# Patient Record
Sex: Female | Born: 1980 | Race: White | Hispanic: No | Marital: Married | Smoking: Never smoker
Health system: Southern US, Community
[De-identification: ages and names within clinical notes are randomized; demographics above are authoritative.]

## PROBLEM LIST (undated history)

## (undated) DIAGNOSIS — G4700 Insomnia, unspecified: Secondary | ICD-10-CM

## (undated) DIAGNOSIS — N135 Crossing vessel and stricture of ureter without hydronephrosis: Secondary | ICD-10-CM

## (undated) DIAGNOSIS — Z808 Family history of malignant neoplasm of other organs or systems: Secondary | ICD-10-CM

## (undated) DIAGNOSIS — K219 Gastro-esophageal reflux disease without esophagitis: Secondary | ICD-10-CM

## (undated) DIAGNOSIS — Z801 Family history of malignant neoplasm of trachea, bronchus and lung: Secondary | ICD-10-CM

## (undated) DIAGNOSIS — R87619 Unspecified abnormal cytological findings in specimens from cervix uteri: Secondary | ICD-10-CM

## (undated) DIAGNOSIS — F329 Major depressive disorder, single episode, unspecified: Secondary | ICD-10-CM

## (undated) DIAGNOSIS — T7840XA Allergy, unspecified, initial encounter: Secondary | ICD-10-CM

## (undated) DIAGNOSIS — F32A Depression, unspecified: Secondary | ICD-10-CM

## (undated) DIAGNOSIS — Z803 Family history of malignant neoplasm of breast: Secondary | ICD-10-CM

## (undated) DIAGNOSIS — Z8049 Family history of malignant neoplasm of other genital organs: Secondary | ICD-10-CM

## (undated) DIAGNOSIS — Z87448 Personal history of other diseases of urinary system: Secondary | ICD-10-CM

## (undated) DIAGNOSIS — E669 Obesity, unspecified: Secondary | ICD-10-CM

## (undated) HISTORY — PX: COLPOSCOPY: SHX161

## (undated) HISTORY — DX: Depression, unspecified: F32.A

## (undated) HISTORY — DX: Crossing vessel and stricture of ureter without hydronephrosis: N13.5

## (undated) HISTORY — DX: Major depressive disorder, single episode, unspecified: F32.9

## (undated) HISTORY — DX: Unspecified abnormal cytological findings in specimens from cervix uteri: R87.619

## (undated) HISTORY — DX: Obesity, unspecified: E66.9

## (undated) HISTORY — DX: Family history of malignant neoplasm of other genital organs: Z80.49

## (undated) HISTORY — DX: Insomnia, unspecified: G47.00

## (undated) HISTORY — DX: Family history of malignant neoplasm of other organs or systems: Z80.8

## (undated) HISTORY — DX: Personal history of other diseases of urinary system: Z87.448

## (undated) HISTORY — DX: Family history of malignant neoplasm of breast: Z80.3

## (undated) HISTORY — DX: Family history of malignant neoplasm of trachea, bronchus and lung: Z80.1

## (undated) HISTORY — DX: Gastro-esophageal reflux disease without esophagitis: K21.9

## (undated) HISTORY — DX: Allergy, unspecified, initial encounter: T78.40XA

---
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
## 2005-07-23 DIAGNOSIS — R87619 Unspecified abnormal cytological findings in specimens from cervix uteri: Secondary | ICD-10-CM

## 2005-07-23 HISTORY — DX: Unspecified abnormal cytological findings in specimens from cervix uteri: R87.619

## 2008-05-18 ENCOUNTER — Ambulatory Visit: Payer: Self-pay | Admitting: Obstetrics & Gynecology

## 2009-04-26 ENCOUNTER — Encounter: Payer: Self-pay | Admitting: Obstetrics & Gynecology

## 2009-04-26 ENCOUNTER — Ambulatory Visit: Payer: Self-pay | Admitting: Obstetrics & Gynecology

## 2009-04-26 LAB — CONVERTED CEMR LAB
ALT: 8 units/L (ref 0–35)
AST: 10 units/L (ref 0–37)
Albumin: 4.3 g/dL (ref 3.5–5.2)
Alkaline Phosphatase: 86 units/L (ref 39–117)
BUN: 9 mg/dL (ref 6–23)
CO2: 24 meq/L (ref 19–32)
Calcium: 9.7 mg/dL (ref 8.4–10.5)
Chloride: 106 meq/L (ref 96–112)
Creatinine, Ser: 0.67 mg/dL (ref 0.40–1.20)
Glucose, Bld: 93 mg/dL (ref 70–99)
HCT: 44.2 % (ref 36.0–46.0)
HCV Ab: NEGATIVE
Hemoglobin: 14 g/dL (ref 12.0–15.0)
Hepatitis B Surface Ag: NEGATIVE
MCHC: 31.7 g/dL (ref 30.0–36.0)
MCV: 89.8 fL (ref 78.0–100.0)
Platelets: 375 10*3/uL (ref 150–400)
Potassium: 4.8 meq/L (ref 3.5–5.3)
RBC: 4.92 M/uL (ref 3.87–5.11)
RDW: 12.8 % (ref 11.5–15.5)
Sodium: 142 meq/L (ref 135–145)
TSH: 1.326 microintl units/mL (ref 0.350–4.500)
Total Bilirubin: 0.9 mg/dL (ref 0.3–1.2)
Total Protein: 7.2 g/dL (ref 6.0–8.3)
WBC: 7.9 10*3/uL (ref 4.0–10.5)

## 2010-07-05 ENCOUNTER — Ambulatory Visit: Payer: Self-pay | Admitting: Obstetrics & Gynecology

## 2010-12-05 NOTE — Assessment & Plan Note (Signed)
Cassandra Wyatt, Cassandra Wyatt NO.:  [id number]   MEDICAL RECORD NO.:  [id number]          PATIENT TYPE:  POB   LOCATION:  [facility] at [facility]         FACILITY:  [facility]   PHYSICIAN:  Jaynie Collins, MD     DATE OF BIRTH:  1981/03/17   DATE OF SERVICE:  04/26/2009                                  CLINIC NOTE   The patient is a 30 year old gravida 0 with last menstrual period of  April 19, 2009, who is here for annual examination.  The patient has  no gynecologic complaints.  She denies any intermenstrual bleeding,  abnormal vaginal discharge, and is satisfied on her Zovia.  The patient  does report having a concern about her left breast infected at the  nipple ring site.  She has noted some yellowish drainage over the past  few weeks.  No tenderness or erythema, but she is concerned about  possible infection and wants antibiotics for this.  She denies any other  complaints.   PAST OBSTETRIC AND GYNECOLOGIC HISTORY:  Menarche at age 3 and the  patient currently has regular menstrual periods on Zovia.  She uses  condoms for extra contraception and also for sexually transmitted  infection prevention.  Her last Pap smear was on September 04, 2007, and  was negative.  She does have a history of cervical dysplasia in 2007 and  subsequent colposcopy and repeat Pap smears were negative.  She had a  mammogram because of some breast issues in 2007 which was normal.  No  recurrence in any breast problems apart from what was mentioned in the  history of present illness.   PAST MEDICAL PROBLEMS:  Bifurcated ureter.   PAST SURGICAL HISTORY:  Correction of the bifurcated ureter, which  occurred in December 27, 1994, at [facility].   MEDICATIONS:  Zovia and multivitamins.   ALLERGIES:  The patient is allergic to AUGMENTIN which causes hives;  POISON IVY which causes blisters that get as big as rocks and are  diffuse and BEE and WASP STINGS for which the site gets swollen for  several  weeks.  She denies any anaphylaxis reaction to this or any of  her other allergies.  She is not allergic to latex.   SOCIAL HISTORY:  The patient lives with her mother.  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  The patient is currently in  school, trying to get her third degree of bachelors in criminal justice  and she goes to [employer].  The patient does not smoke, drink,  or use any illicit drugs.  She has no past or current history of  physical or sexual abuse.   FAMILY HISTORY:  Notable for breast and cervical cancer in her mother  and maternal grandmother.  Her mother was diagnosed with breast cancer  at age 26, so the patient is eligible for mammograms starting at age 30.  She also had an extensive family history of diabetes, heart disease,  heart attack, and depression.   REVIEW OF SYSTEMS:  The patient just reports her concern about her left  breast.  She also endorses having some swelling in  her legs which has  been going on for years.  The patient does not have any primary care  physician.   PHYSICAL EXAMINATION:  VITAL SIGNS:  Pulse 87, blood pressure 127/90,  weight 226 pounds, height 6 feet.  GENERAL:  No apparent distress.  HEENT:  Normocephalic, atraumatic.  NECK:  Supple.  No masses.  Normal thyroid.  HEART:  Regular rate and rhythm.  LUNGS:  Clear to auscultation bilaterally.  BREASTS:  Symmetric in size, soft, nontender, no abnormal masses  palpated.  No skin changes, nipple drainage, or lymphadenopathy.  The  patient does have bilateral nipple rings, but no discharge was expressed  or any erythema or any other concerning signs of infection.  ABDOMEN:  Soft, nontender, and nondistended.  The patient does have a  pierced umbilicus.  EXTREMITIES:  No clubbing, cyanosis, or edema that was noted today on  examination.  PELVIC:  Normal external female genitalia.  Pink, well-rugated vagina,  normal discharge.  Nulliparous cervical os noted.   Pap smear obtained.  On bimanual exam, the patient has a small mobile uterus and normal  adnexa bilaterally.   ASSESSMENT AND PLAN:  The patient is a 30 year old gravida 0 who is here  for annual examination.  The patient had a normal breast examination.  She was told that if her infections recur, she does need to take off her  nipple ring and she was given a prescription for ciprofloxacin to take  if she does have a recurrent breast infection.  The patient also had a  Pap Smear today.  We will follow up on the results.  She is interested  in having sexually transmitted infection screening and also preventative  health maintenance labs.  These will all be drawn today and we will  follow up on the results.  The patient was told to come back for any  further gynecologic concerns or in 1 year for annual examination.           ______________________________  Jaynie Collins, MD     UA/MEDQ  D:  04/26/2009  T:  04/27/2009  Job:  [id number]

## 2010-12-05 NOTE — Assessment & Plan Note (Signed)
Cassandra Wyatt, ACHENBACH NO.:  [id number]   MEDICAL RECORD NO.:  [id number]          PATIENT TYPE:  POB   LOCATION:  [facility] at [facility]         FACILITY:  [facility]   PHYSICIAN:  Johnella Moloney, MD        DATE OF BIRTH:  [birth year]   DATE OF SERVICE:  05/18/2008                                  CLINIC NOTE   The patient is a 30 year old gravida zero who is here for birth control  consult.  The patient reports that since 2007, she was placed on Ortho  Tri-Cyclen and started to have irregular bleeding.  She went to multiple  pills, including Ortho-Cyclen, Yaz, and another pill that she cannot  remember at this point, but continued to have irregular spotting.  She  was placed on Depo-Provera earlier this year, but proceeded to have 6  months of constant bleeding ranging from spotting to a full period and  she also gained 40 pounds in 3 months with no change in her food or  exercise as she kept a strict diary of both of these.  Her last Depo-  Provera shot was in May 2009.  The patient's last period was May 04, 2008.  She had 10 days of bleeding.  She is here today interested in  using another form of birth control to see if this can help in  regulating her periods and also in providing adequate contraception.  The patient had no other concerns.   PAST OBSTETRICAL/GYNECOLOGICAL HISTORY:  The patient had menarche at age  43 which was characterized by spotting.  Her regular menstrual  periods did not start until she was age 67.  She had regular menstrual  cycles and periods lasting 10-12 days with 25-27 day cycles  characterized by heavy bleeding and mild to moderate pain.  Her periods  were regular until she was started on birth control pills according to  the patient.  She has never been pregnant.  She also uses condoms for  extra contraception and also STI prevention.  Her last Pap smear was on  September 04, 2007.  She does have a history of cervical dysplasia in  2007 and subsequent colpo was benign and repeat Paps were negative.  Her  last mammogram was in 2007 which was normal.   PAST MEDICAL HISTORY:  Kidney problems.   PAST SURGICAL HISTORY:  She had a bifurcated ureter and she underwent  surgery for correction of this on December 27, 1994 at [facility].   MEDICATIONS:  Multivitamins.   ALLERGIES:  The patient is allergic to AUGMENTIN, BEES, WASPS AND POISON  IVY.   SOCIAL HISTORY:  The patient lives with her mother.  She currently works  in a school system.  She does not smoke, drink alcohol or use any  illicit drugs.  She denies any past or current history of sexual  physical abuse.   FAMILY HISTORY:  Is notable for breast and cervical cancer in her mother  and maternal grandmother.  She also has an extensive family history of  diabetes, heart disease, heart attack and depression.   REVIEW OF SYSTEMS:  The patient endorses weight gain and  swelling in  legs.   IMPRESSION:  The patient is a 30 year old G zero here for a consultation  for birth control.  Given her problems with past pills it is likely that  her irregular bleeding was caused by not having enough estrogen present.  Given that her bleeding was started on a triphasic pill and also Ortho  Cyclen Low, the patient will be given a prescription for Zovia 1/35.  Zovia contains ethynodiol diacetate and is progestin.  This is a very  estrogenic progestin in addition to do regular estradiol and this should  provide some stable levy to her endometrial lining.  We will try this  for the next 2 months and see if her bleeding profile does improve.  The  patient does understand that if her bleeding does not improve on this,  we will discuss other options such as an IUD or NuvaRing.  She is not  interested in these modalities at the moment.  The patient will follow  up in 2 months for OCP surveillance and followup of her bleeding.           ______________________________  Johnella Moloney,  MD     UD/MEDQ  D:  05/18/2008  T:  05/18/2008  Job:  [id number]

## 2010-12-05 NOTE — Assessment & Plan Note (Signed)
NAMEPERL, FOLMAR NO.:  [id number]   MEDICAL RECORD NO.:  [id number]          PATIENT TYPE:  POB   LOCATION:  [facility] at [facility]         FACILITY:  [facility]   PHYSICIAN:  Scheryl Darter, MD       DATE OF BIRTH:  12-31-1980   DATE OF SERVICE:                                  CLINIC NOTE   Patient comes today for yearly examination.  She wants to restart oral  contraceptives.  The patient is a 29 year old white female gravida 0,  last menstrual period June 16, 2010.  She has run out of her oral  contraceptives about 2 months ago, but she wants to restart now, she  needs new prescription.  She has used condoms since stopping her pills.  Her periods have been regular.   PAST MEDICAL HISTORY:  Bifurcated ureters bilaterally in 1996.   MEDICATIONS:  Tylenol p.r.n. headache.   ALLERGY:  To AUGMENTIN, POISON IVY, and BEE AND WASP STINGS.   SOCIAL HISTORY:  The patient is single.  She denies any alcohol,  tobacco, or drug use.  No history of abuse.   FAMILY HISTORY:  Breast and cervical cancer in her mother and maternal  grandmother.  Also history of diabetes, heart disease, heart attack, and  depression.   REVIEW OF SYSTEMS:  The patient has had regular menses off her oral  contraceptives.  No nausea and vomiting, fever, weight change, pain,  diarrhea, swelling.   PHYSICAL EXAMINATION:  GENERAL:  The patient in no acute distress.  Normal affect.  VITAL SIGNS:  Weight is 246 pounds, height 5 feet 11 inches, blood  pressure 113/82, pulse 80.  CHEST:  Clear.  HEART:  Regular rhythm.  No thyromegaly.  BREASTS:  Symmetric.  No mass, tenderness, or axillary lymphadenopathy.  ABDOMEN:  Mildly obese, soft, nontender.  No mass.  EXTREMITIES:  Show no swelling or deformity.  PELVIC:  External genitalia, vagina and cervix are nulliparous.  Pap was  done.  Uterus normal size, nontender.  No adnexal masses.   IMPRESSION:  Patient is doing well, currently off her  oral  contraceptives.  She can restart these.   PLAN:  Gave prescription for Zovia.  She can start this after the onset  of her next menses.   RECOMMENDATION:  She return for yearly exams.  She will notify us of her  Pap test results.      Scheryl Darter, MD     JA/MEDQ  D:  07/05/2010  T:  07/06/2010  Job:  [id number]

## 2011-07-10 ENCOUNTER — Encounter: Payer: Self-pay | Admitting: Obstetrics and Gynecology

## 2011-07-10 ENCOUNTER — Ambulatory Visit (INDEPENDENT_AMBULATORY_CARE_PROVIDER_SITE_OTHER): Payer: 59 | Admitting: Obstetrics and Gynecology

## 2011-07-10 DIAGNOSIS — Z01419 Encounter for gynecological examination (general) (routine) without abnormal findings: Secondary | ICD-10-CM

## 2011-07-10 DIAGNOSIS — Z113 Encounter for screening for infections with a predominantly sexual mode of transmission: Secondary | ICD-10-CM

## 2011-07-10 DIAGNOSIS — Z124 Encounter for screening for malignant neoplasm of cervix: Secondary | ICD-10-CM

## 2011-07-10 DIAGNOSIS — Z1272 Encounter for screening for malignant neoplasm of vagina: Secondary | ICD-10-CM

## 2011-07-10 DIAGNOSIS — N920 Excessive and frequent menstruation with regular cycle: Secondary | ICD-10-CM

## 2011-07-10 NOTE — Progress Notes (Signed)
  Subjective:    Patient ID: Cassandra Wyatt, female    DOB: 1981-01-17, 30 y.o.   MRN: [id number]  HPI  30yo G0 here for annual exam. Patient has been doing well and has lost 32lb since her last visit. Patient reports breakthrough bleeding with current birth control and desires the IUD. Patient is otherwise without any complaints. She is currently in a monogamous relationship for the past 3 months and desires STD testing  Past Medical History  Diagnosis Date  . 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 History of kidney problems   . Abnormal Pap smear of cervix 2007   Past Surgical History  Procedure Date  . Ureter surgery 12/1994    correction of bifurcated ureter  . Colposcopy    Family History  Problem Relation Age of Onset  . Diabetes Mother   . Cancer Mother 65    cervical and breast /2005  . Cancer Maternal Grandmother      cervical  . Heart attack Maternal Grandmother   . Heart attack Father   . Diabetes Maternal Grandfather   . Heart disease Maternal Grandfather   . Heart attack Maternal Grandfather   . Heart disease Paternal Grandmother    History  Substance Use Topics  . Smoking status: Never Smoker   . Smokeless tobacco: Not on file  . Alcohol Use: No     Review of Systems  All other systems reviewed and are negative.       Objective:   Physical Exam GENERAL: Well-developed, well-nourished female in no acute distress.  HEENT: Normocephalic, atraumatic. Sclerae anicteric.  NECK: Supple. Normal thyroid.  LUNGS: Clear to auscultation bilaterally.  HEART: Regular rate and rhythm. BREASTS: Symmetric in size. No palpable masses or lymphadenopathy, skin changes, or nipple drainage. ABDOMEN: Soft, nontender, nondistended. No organomegaly. PELVIC: Normal external female genitalia. Vagina is pink and rugated.  Normal discharge. Normal appearing cervix. Uterus is normal in size. No adnexal mass or tenderness. EXTREMITIES: No cyanosis,  clubbing, or edema, 2+ distal pulses.     Assessment & Plan:  30 yo Go here for annual exam -pap smear collected -patient will be tested for HIV, hep B, C and RPR -patient declined flu vaccine - patient will be contacted with any abnormal results - patient to return for IUD insertion - patient encouraged to continue her weight loss regimen

## 2011-07-10 NOTE — Patient Instructions (Signed)

## 2011-07-11 ENCOUNTER — Other Ambulatory Visit: Payer: Self-pay | Admitting: Obstetrics & Gynecology

## 2011-07-11 LAB — HIV ANTIBODY (ROUTINE TESTING W REFLEX): HIV: NONREACTIVE

## 2011-07-11 LAB — HEPATITIS B SURFACE ANTIGEN: Hepatitis B Surface Ag: NEGATIVE

## 2011-07-18 ENCOUNTER — Ambulatory Visit: Payer: 59 | Admitting: Obstetrics & Gynecology

## 2011-07-30 ENCOUNTER — Ambulatory Visit (INDEPENDENT_AMBULATORY_CARE_PROVIDER_SITE_OTHER): Payer: 59 | Admitting: Obstetrics & Gynecology

## 2011-07-30 ENCOUNTER — Encounter: Payer: Self-pay | Admitting: Obstetrics & Gynecology

## 2011-07-30 VITALS — BP 127/81 | Ht 71.0 in | Wt 230.1 lb

## 2011-07-30 DIAGNOSIS — Z30430 Encounter for insertion of intrauterine contraceptive device: Secondary | ICD-10-CM

## 2011-07-30 DIAGNOSIS — Z309 Encounter for contraceptive management, unspecified: Secondary | ICD-10-CM

## 2011-07-30 MED ORDER — MISOPROSTOL 200 MCG PO TABS: ORAL_TABLET | ORAL | Status: DC

## 2011-07-30 NOTE — Progress Notes (Signed)
Addended by: Pennie Banter on: 07/30/2011 02:38 PM   Modules accepted: Orders

## 2011-07-30 NOTE — Progress Notes (Signed)
  Subjective:    Patient ID: Cassandra Wyatt, female    DOB: 16-Mar-1981, 31 y.o.   MRN: [id number]  HPI  Carrissa is here for insertion of Mirena. All questions were answered. Pregnancy test was negative.  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      Assessment & Plan:  Desire for Mirena with nullip cervix. I will have her reschedule the Mirena insertion after she has been premedicated with cytotec.

## 2011-08-07 ENCOUNTER — Ambulatory Visit: Payer: 59 | Admitting: Obstetrics and Gynecology

## 2011-08-07 ENCOUNTER — Encounter: Payer: Self-pay | Admitting: Advanced Practice Midwife

## 2011-08-07 ENCOUNTER — Ambulatory Visit (INDEPENDENT_AMBULATORY_CARE_PROVIDER_SITE_OTHER): Payer: 59 | Admitting: Advanced Practice Midwife

## 2011-08-07 VITALS — BP 109/75 | HR 80 | Ht 71.0 in | Wt 230.0 lb

## 2011-08-07 DIAGNOSIS — N882 Stricture and stenosis of cervix uteri: Secondary | ICD-10-CM

## 2011-08-07 DIAGNOSIS — Z87898 Personal history of other specified conditions: Secondary | ICD-10-CM | POA: Insufficient documentation

## 2011-08-07 DIAGNOSIS — Z308 Encounter for other contraceptive management: Secondary | ICD-10-CM

## 2011-08-07 DIAGNOSIS — Z975 Presence of (intrauterine) contraceptive device: Secondary | ICD-10-CM

## 2011-08-07 LAB — POCT URINE PREGNANCY: Preg Test, Ur: NEGATIVE

## 2011-08-07 NOTE — Progress Notes (Signed)
  Subjective:    Patient ID: Cassandra Wyatt, female    DOB: [birth year], 31 y.o.   MRN: [id number]  HPI Here for re-attempt at Mirena IUD insertion.  Dr Marice Potter was not able to insert on last visit due to stenotic nulliparous cervix. Was given Cytotec to take last night. Has had breakthrough bleeding on pills and weight gain on DepoProvera. Really wants Mirena.   Review of Systems Negative    Objective:   Physical Exam  Constitutional: She is oriented to person, place, and time. She appears well-developed and well-nourished. No distress.  HENT:  Head: Normocephalic.  Pulmonary/Chest: Effort normal.  Abdominal: Soft. She exhibits no distension and no mass. There is no tenderness. There is no rebound and no guarding.  Genitourinary: Vagina normal and uterus normal. No vaginal discharge found.       Nulliparous cervix. Uterus small and midplane. Vagina normal.   Musculoskeletal: Normal range of motion.  Neurological: She is alert and oriented to person, place, and time.  Skin: Skin is warm and dry.          Assessment & Plan:  A:  Desires Mirena IUD P:  Time out performed. Pt desires re-attempt at insertion       Cervix cleansed with Betadine.       Tenaculum applied anteriorly        Attempted insertion of uterine sound without success.         Attempted to insert cotton swab (thin one with metal stem) without success. Able to insert only 2 cm into os.  Tried again with sound, unsuccessfully       Did not attempt Mirena insertion.      Discussed with Dr Debroah Loop who suggested alternative contraception. Pt had been called by Dr Marice Potter and offered the new Mirena which is smaller. She wants to try that in February. Given OCPs samples to use in the meantime. If heavy bleeding occurs, take 2 pills a day for several days to slow it down. Does not want to use Depo. Boyfriend is visiting in 2 wks and she needs contraception.

## 2011-09-12 ENCOUNTER — Telehealth: Payer: Self-pay | Admitting: *Deleted

## 2011-09-12 DIAGNOSIS — IMO0001 Reserved for inherently not codable concepts without codable children: Secondary | ICD-10-CM

## 2011-09-12 MED ORDER — NORETHIN-ETH ESTRAD-FE BIPHAS 1 MG-10 MCG / 10 MCG PO TABS: 1.0000 | ORAL_TABLET | Freq: Every day | ORAL | Status: DC

## 2011-09-12 NOTE — Telephone Encounter (Signed)
Patient needs a refill of Lo lo estrin fe it is working well until we can get the [place] device in.

## 2011-09-13 ENCOUNTER — Ambulatory Visit: Payer: 59 | Admitting: Obstetrics & Gynecology

## 2011-10-15 ENCOUNTER — Encounter: Payer: Self-pay | Admitting: Obstetrics & Gynecology

## 2011-10-15 ENCOUNTER — Ambulatory Visit (INDEPENDENT_AMBULATORY_CARE_PROVIDER_SITE_OTHER): Payer: 59 | Admitting: Obstetrics & Gynecology

## 2011-10-15 VITALS — BP 125/95 | HR 100 | Ht 71.0 in | Wt 236.0 lb

## 2011-10-15 DIAGNOSIS — Z975 Presence of (intrauterine) contraceptive device: Secondary | ICD-10-CM

## 2011-10-15 DIAGNOSIS — Z01812 Encounter for preprocedural laboratory examination: Secondary | ICD-10-CM

## 2011-10-15 DIAGNOSIS — Z30430 Encounter for insertion of intrauterine contraceptive device: Secondary | ICD-10-CM

## 2011-10-15 LAB — POCT URINE PREGNANCY: Preg Test, Ur: NEGATIVE

## 2011-10-15 NOTE — Progress Notes (Signed)
Addended by: Barbara Cower on: 10/15/2011 04:13 PM   Modules accepted: Orders

## 2011-10-15 NOTE — Progress Notes (Signed)
  Subjective:    Patient ID: Cassandra Wyatt, female    DOB: 31-Mar-1981, 31 y.o.   MRN: [id number]  HPI Cassandra Wyatt would really like to IUD for birth control because she gained 60 pounds with the depo provera and she has DUB/BTB with the OCPs and she scars badly so she doesn't want a Nexplanon.   Review of Systems     Objective:   Physical Exam  Bimanual exam done, cervix prepped with betadine. Anterior lip of cervix grasped with single tooth tenaculum. Neither a yellow dilator or a uterine sound would go past 2 cm into the os. I did try to place the [facility], but it also wouldn't go past 2 cm past the os.      Assessment & Plan:  She will try the Nuva ring.

## 2012-01-08 ENCOUNTER — Telehealth: Payer: Self-pay

## 2012-01-08 NOTE — Telephone Encounter (Signed)
Patient called requesting Nuva Ring refill. We gave her a couple of samples to try out on her last visit and she liked it. Per nurse I called in Nuva ring in with 5 refills to her pharmacy.

## 2012-01-11 ENCOUNTER — Encounter: Payer: Self-pay | Admitting: Obstetrics and Gynecology

## 2012-12-09 ENCOUNTER — Ambulatory Visit (INDEPENDENT_AMBULATORY_CARE_PROVIDER_SITE_OTHER): Payer: BC Managed Care – PPO | Admitting: Family Medicine

## 2012-12-09 ENCOUNTER — Ambulatory Visit (HOSPITAL_COMMUNITY)
Admission: RE | Admit: 2012-12-09 | Discharge: 2012-12-09 | Disposition: A | Discharge status: Home / Self Care | Payer: BC Managed Care – PPO | Source: Ambulatory Visit | Attending: Family Medicine | Admitting: Family Medicine

## 2012-12-09 VITALS — BP 110/68 | HR 82 | Temp 98.2°F | Resp 16 | Ht 71.0 in | Wt 259.0 lb

## 2012-12-09 DIAGNOSIS — R609 Edema, unspecified: Secondary | ICD-10-CM

## 2012-12-09 DIAGNOSIS — R635 Abnormal weight gain: Secondary | ICD-10-CM

## 2012-12-09 DIAGNOSIS — R600 Localized edema: Secondary | ICD-10-CM

## 2012-12-09 DIAGNOSIS — G4700 Insomnia, unspecified: Secondary | ICD-10-CM

## 2012-12-09 LAB — POCT CBC
Granulocyte percent: 73.7 %G (ref 37–80)
HCT, POC: 43.9 % (ref 37.7–47.9)
MID (cbc): 0.4 (ref 0–0.9)
POC Granulocyte: 6 (ref 2–6.9)
POC LYMPH PERCENT: 21.7 %L (ref 10–50)
Platelet Count, POC: 369 10*3/uL (ref 142–424)
RBC: 4.85 M/uL (ref 4.04–5.48)
RDW, POC: 13.2 %

## 2012-12-09 LAB — COMPREHENSIVE METABOLIC PANEL
ALT: <8 U/L (ref 0–35)
AST: 9 U/L (ref 0–37)
Albumin: 4 g/dL (ref 3.5–5.2)
Alkaline Phosphatase: 86 U/L (ref 39–117)
Potassium: 4.2 mEq/L (ref 3.5–5.3)
Sodium: 139 mEq/L (ref 135–145)
Total Protein: 6.8 g/dL (ref 6.0–8.3)

## 2012-12-09 MED ORDER — FUROSEMIDE 20 MG PO TABS: 20.0000 mg | ORAL_TABLET | Freq: Every day | ORAL | Status: DC

## 2012-12-09 MED ORDER — TRAZODONE HCL 50 MG PO TABS: 25.0000 mg | ORAL_TABLET | Freq: Every evening | ORAL | Status: DC | PRN

## 2012-12-09 NOTE — Patient Instructions (Addendum)
Insomnia Insomnia is frequent trouble falling and/or staying asleep. Insomnia can be a long term problem or a short term problem. Both are common. Insomnia can be a short term problem when the wakefulness is related to a certain stress or worry. Long term insomnia is often related to ongoing stress during waking hours and/or poor sleeping habits. Overtime, sleep deprivation itself can make the problem worse. Every little thing feels more severe because you are overtired and your ability to cope is decreased. CAUSES   Stress, anxiety, and depression.  Poor sleeping habits.  Distractions such as TV in the bedroom.  Naps close to bedtime.  Engaging in emotionally charged conversations before bed.  Technical reading before sleep.  Alcohol and other sedatives. They may make the problem worse. They can hurt normal sleep patterns and normal dream activity.  Stimulants such as caffeine for several hours prior to bedtime.  Pain syndromes and shortness of breath can cause insomnia.  Exercise late at night.  Changing time zones may cause sleeping problems (jet lag). It is sometimes helpful to have someone observe your sleeping patterns. They should look for periods of not breathing during the night (sleep apnea). They should also look to see how long those periods last. If you live alone or observers are uncertain, you can also be observed at a sleep clinic where your sleep patterns will be professionally monitored. Sleep apnea requires a checkup and treatment. Give your caregivers your medical history. Give your caregivers observations your family has made about your sleep.  SYMPTOMS   Not feeling rested in the morning.  Anxiety and restlessness at bedtime.  Difficulty falling and staying asleep. TREATMENT   Your caregiver may prescribe treatment for an underlying medical disorders. Your caregiver can give advice or help if you are using alcohol or other drugs for self-medication. Treatment  of underlying problems will usually eliminate insomnia problems.  Medications can be prescribed for short time use. They are generally not recommended for lengthy use.  Over-the-counter sleep medicines are not recommended for lengthy use. They can be habit forming.  You can promote easier sleeping by making lifestyle changes such as:  Using relaxation techniques that help with breathing and reduce muscle tension.  Exercising earlier in the day.  Changing your diet and the time of your last meal. No night time snacks.  Establish a regular time to go to bed.  Counseling can help with stressful problems and worry.  Soothing music and white noise may be helpful if there are background noises you cannot remove.  Stop tedious detailed work at least one hour before bedtime. HOME CARE INSTRUCTIONS   Keep a diary. Inform your caregiver about your progress. This includes any medication side effects. See your caregiver regularly. Take note of:  Times when you are asleep.  Times when you are awake during the night.  The quality of your sleep.  How you feel the next day. This information will help your caregiver care for you.  Get out of bed if you are still awake after 15 minutes. Read or do some quiet activity. Keep the lights down. Wait until you feel sleepy and go back to bed.  Keep regular sleeping and waking hours. Avoid naps.  Exercise regularly.  Avoid distractions at bedtime. Distractions include watching television or engaging in any intense or detailed activity like attempting to balance the household checkbook.  Develop a bedtime ritual. Keep a familiar routine of bathing, brushing your teeth, climbing into bed at the same   time each night, listening to soothing music. Routines increase the success of falling to sleep faster.  Use relaxation techniques. This can be using breathing and muscle tension release routines. It can also include visualizing peaceful scenes. You can  also help control troubling or intruding thoughts by keeping your mind occupied with boring or repetitive thoughts like the old concept of counting sheep. You can make it more creative like imagining planting one beautiful flower after another in your backyard garden.  During your day, work to eliminate stress. When this is not possible use some of the previous suggestions to help reduce the anxiety that accompanies stressful situations. MAKE SURE YOU:   Understand these instructions.  Will watch your condition.  Will get help right away if you are not doing well or get worse. Document Released: 07/06/2000 Document Revised: 10/01/2011 Document Reviewed: 08/06/2007 [facility] Patient Information 2013 [place], [place].   Take trazodone 50 mg 1/2 hr before anticipated bedtime  Exercise  Use the furosemide 20 mg one each AM to see if it helps the swelling

## 2012-12-09 NOTE — Progress Notes (Signed)
Right lower extremity venous duplex.  Right:  No evidence of DVT, superficial thrombosis, or Baker's cyst.  Left:  Negative for DVT in the common femoral vein.

## 2012-12-09 NOTE — Progress Notes (Signed)
Subjective: Patient is here for 2 reasons. She is generally been healthy, but over the last couple of months she's had swelling of her regularly on a daily basis in her right leg. She's not had calf pain. She has not had edema in the left leg. 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 She does not get regular exercise. Nose no trauma to the leg. She did have some cutdowns when she was an infant.  She has gained a lot of weight the last few years. Several years ago she was placed in the upper Provera and then a three-month period of time gained about 6 pounds. She is persistence they have since then, though over the last year or so she's gained about 20 more. She says she is happy and content in life. She lives at her mothers. She is not under any major stressors.  She has problems with sleep. She had this which is a teenager and was on Remeron for a long time. She lost insurance and had loose of mastication. She's not been on it for many years, but then son is doing worse and worse. She lays awake for hours. She goes home from work, has dinner, reads, and goes on to bed. She has not get a lot of regular exercise.  Objective: Ablation lady in no acute distress. Neck supple without any thyromegaly. Chest is clear to. Heart regular without murmurs. Abdomen soft without mass or tenderness. Has 2+ pitting edema of the right leg. No calf tenderness. The edema is primarily in the ankles in the ankle area. No abnormality visible in the foot or toes. Pulses are present no not very strong.  Assessment unilateral ankle edema, rule out chronic DVT and venous valvular insufficiency Weight gain, round hyperthyroidism Sleep disturbance  Plan: Check labs Venous ultrasound of the right leg Lasix 20 daily as a trial. I doubt it we'll do the job. We'll probably need to work hard on weight loss and continue wearing compression hose Discuss sleep issues Trazodone  Return in 3 or 4 months if symptoms a doing  better and if she needs more medications, otherwise as needed.

## 2012-12-10 ENCOUNTER — Telehealth: Payer: Self-pay

## 2012-12-10 NOTE — Telephone Encounter (Signed)
Pt was seen yesterday by Dr. Alwyn Ren and she was wanting to know if the medication she was given could be switched to what she has taken in the past. Call back number is [phone]

## 2012-12-10 NOTE — Telephone Encounter (Signed)
What was she given in the past? She is asking for Remeron instead of the Trazadone, this has caused her to have a headache. Please advise.

## 2012-12-11 MED ORDER — MIRTAZAPINE 15 MG PO TABS: 15.0000 mg | ORAL_TABLET | Freq: Every day | ORAL | Status: DC

## 2012-12-11 NOTE — Telephone Encounter (Signed)
Notified pt who thanked [place].

## 2012-12-11 NOTE — Telephone Encounter (Signed)
Rx sent to pharmacy   

## 2012-12-11 NOTE — Telephone Encounter (Signed)
Message sent to Dr Alwyn Ren.

## 2012-12-11 NOTE — Telephone Encounter (Signed)
PT STATES SOMEONE SAID THEY WERE GOING TO CALL HER BACK AND SHE HADN'T HEARD FROM ANYONE. REALLY WOULD LIKE TO HAVE THE REMERON CALLED IN FOR HER PLEASE CALL [phone]     [facility] ON [address]

## 2012-12-11 NOTE — Telephone Encounter (Signed)
Pt is calling back to check status on Rx change because her mother can go get the Rx for her now. Can someone else please review this in Dr Hopper's absence?

## 2013-01-07 ENCOUNTER — Encounter: Payer: Self-pay | Admitting: Obstetrics & Gynecology

## 2013-01-07 ENCOUNTER — Ambulatory Visit (INDEPENDENT_AMBULATORY_CARE_PROVIDER_SITE_OTHER): Payer: BC Managed Care – PPO | Admitting: Obstetrics & Gynecology

## 2013-01-07 ENCOUNTER — Other Ambulatory Visit: Payer: Self-pay | Admitting: Physician Assistant

## 2013-01-07 VITALS — BP 117/86 | HR 85 | Ht 71.0 in | Wt 262.0 lb

## 2013-01-07 DIAGNOSIS — Z309 Encounter for contraceptive management, unspecified: Secondary | ICD-10-CM

## 2013-01-07 DIAGNOSIS — Z01419 Encounter for gynecological examination (general) (routine) without abnormal findings: Secondary | ICD-10-CM

## 2013-01-07 DIAGNOSIS — Z124 Encounter for screening for malignant neoplasm of cervix: Secondary | ICD-10-CM

## 2013-01-07 MED ORDER — ETONOGESTREL-ETHINYL ESTRADIOL 0.12-0.015 MG/24HR VA RING: VAGINAL_RING | VAGINAL | Status: DC

## 2013-01-07 NOTE — Patient Instructions (Signed)
Preventive Care for Adults, Female A healthy lifestyle and preventive care can promote health and wellness. Preventive health guidelines for women include the following key practices.  A routine yearly physical is a good way to check with your caregiver about your health and preventive screening. It is a chance to share any concerns and updates on your health, and to receive a thorough exam.  Visit your dentist for a routine exam and preventive care every 6 months. Brush your teeth twice a day and floss once a day. Good oral hygiene prevents tooth decay and gum disease.  The frequency of eye exams is based on your age, health, family medical history, use of contact lenses, and other factors. Follow your caregiver's recommendations for frequency of eye exams.  Eat a healthy diet. Foods like vegetables, fruits, whole grains, low-fat dairy products, and lean protein foods contain the nutrients you need without too many calories. Decrease your intake of foods high in solid fats, added sugars, and salt. Eat the right amount of calories for you.Get information about a proper diet from your caregiver, if necessary.  Regular physical exercise is one of the most important things you can do for your health. Most adults should get at least 150 minutes of moderate-intensity exercise (any activity that increases your heart rate and causes you to sweat) each week. In addition, most adults need muscle-strengthening exercises on 2 or more days a week.  Maintain a healthy weight. The body mass index (BMI) is a screening tool to identify possible weight problems. It provides an estimate of body fat based on height and weight. Your caregiver can help determine your BMI, and can help you achieve or maintain a healthy weight.For adults 20 years and older:  A BMI below 18.5 is considered underweight.  A BMI of 18.5 to 24.9 is normal.  A BMI of 25 to 29.9 is considered overweight.  A BMI of 30 and above is  considered obese.  Maintain normal blood lipids and cholesterol levels by exercising and minimizing your intake of saturated fat. Eat a balanced diet with plenty of fruit and vegetables. Blood tests for lipids and cholesterol should begin at age 20 and be repeated every 5 years. If your lipid or cholesterol levels are high, you are over 50, or you are at high risk for heart disease, you may need your cholesterol levels checked more frequently.Ongoing high lipid and cholesterol levels should be treated with medicines if diet and exercise are not effective.  If you smoke, find out from your caregiver how to quit. If you do not use tobacco, do not start.  If you are pregnant, do not drink alcohol. If you are breastfeeding, be very cautious about drinking alcohol. If you are not pregnant and choose to drink alcohol, do not exceed 1 drink per day. One drink is considered to be 12 ounces (355 mL) of beer, 5 ounces (148 mL) of wine, or 1.5 ounces (44 mL) of liquor.  Avoid use of street drugs. Do not share needles with anyone. Ask for help if you need support or instructions about stopping the use of drugs.  High blood pressure causes heart disease and increases the risk of stroke. Your blood pressure should be checked at least every 1 to 2 years. Ongoing high blood pressure should be treated with medicines if weight loss and exercise are not effective.  If you are 55 to 30 years old, ask your caregiver if you should take aspirin to prevent strokes.  Diabetes   screening involves taking a blood sample to check your fasting blood sugar level. This should be done once every 3 years, after age 45, if you are within normal weight and without risk factors for diabetes. Testing should be considered at a younger age or be carried out more frequently if you are overweight and have at least 1 risk factor for diabetes.  Breast cancer screening is essential preventive care for women. You should practice "breast  self-awareness." This means understanding the normal appearance and feel of your breasts and may include breast self-examination. Any changes detected, no matter how small, should be reported to a caregiver. Women in their 20s and 30s should have a clinical breast exam (CBE) by a caregiver as part of a regular health exam every 1 to 3 years. After age 40, women should have a CBE every year. Starting at age 40, women should consider having a mammography (breast X-ray test) every year. Women who have a family history of breast cancer should talk to their caregiver about genetic screening. Women at a high risk of breast cancer should talk to their caregivers about having magnetic resonance imaging (MRI) and a mammography every year.  The Pap test is a screening test for cervical cancer. A Pap test can show cell changes on the cervix that might become cervical cancer if left untreated. A Pap test is a procedure in which cells are obtained and examined from the lower end of the uterus (cervix).  Women should have a Pap test starting at age 21.  Between ages 21 and 29, Pap tests should be repeated every 2 years.  Beginning at age 30, you should have a Pap test every 3 years as long as the past 3 Pap tests have been normal.  Some women have medical problems that increase the chance of getting cervical cancer. Talk to your caregiver about these problems. It is especially important to talk to your caregiver if a new problem develops soon after your last Pap test. In these cases, your caregiver may recommend more frequent screening and Pap tests.  The above recommendations are the same for women who have or have not gotten the vaccine for human papillomavirus (HPV).  If you had a hysterectomy for a problem that was not cancer or a condition that could lead to cancer, then you no longer need Pap tests. Even if you no longer need a Pap test, a regular exam is a good idea to make sure no other problems are  starting.  If you are between ages 65 and 70, and you have had normal Pap tests going back 10 years, you no longer need Pap tests. Even if you no longer need a Pap test, a regular exam is a good idea to make sure no other problems are starting.  If you have had past treatment for cervical cancer or a condition that could lead to cancer, you need Pap tests and screening for cancer for at least 20 years after your treatment.  If Pap tests have been discontinued, risk factors (such as a new sexual partner) need to be reassessed to determine if screening should be resumed.  The HPV test is an additional test that may be used for cervical cancer screening. The HPV test looks for the virus that can cause the cell changes on the cervix. The cells collected during the Pap test can be tested for HPV. The HPV test could be used to screen women aged 30 years and older, and should   be used in women of any age who have unclear Pap test results. After the age of 30, women should have HPV testing at the same frequency as a Pap test.  Colorectal cancer can be detected and often prevented. Most routine colorectal cancer screening begins at the age of 50 and continues through age 75. However, your caregiver may recommend screening at an earlier age if you have risk factors for colon cancer. On a yearly basis, your caregiver may provide home test kits to check for hidden blood in the stool. Use of a small camera at the end of a tube, to directly examine the colon (sigmoidoscopy or colonoscopy), can detect the earliest forms of colorectal cancer. Talk to your caregiver about this at age 50, when routine screening begins. Direct examination of the colon should be repeated every 5 to 10 years through age 75, unless early forms of pre-cancerous polyps or small growths are found.  Hepatitis C blood testing is recommended for all people born from 1945 through 1965 and any individual with known risks for hepatitis C.  Practice  safe sex. Use condoms and avoid high-risk sexual practices to reduce the spread of sexually transmitted infections (STIs). STIs include gonorrhea, chlamydia, syphilis, trichomonas, herpes, HPV, and human immunodeficiency virus (HIV). Herpes, HIV, and HPV are viral illnesses that have no cure. They can result in disability, cancer, and death. Sexually active women aged 25 and younger should be checked for chlamydia. Older women with new or multiple partners should also be tested for chlamydia. Testing for other STIs is recommended if you are sexually active and at increased risk.  Osteoporosis is a disease in which the bones lose minerals and strength with aging. This can result in serious bone fractures. The risk of osteoporosis can be identified using a bone density scan. Women ages 65 and over and women at risk for fractures or osteoporosis should discuss screening with their caregivers. Ask your caregiver whether you should take a calcium supplement or vitamin D to reduce the rate of osteoporosis.  Menopause can be associated with physical symptoms and risks. Hormone replacement therapy is available to decrease symptoms and risks. You should talk to your caregiver about whether hormone replacement therapy is right for you.  Use sunscreen with sun protection factor (SPF) of 30 or more. Apply sunscreen liberally and repeatedly throughout the day. You should seek shade when your shadow is shorter than you. Protect yourself by wearing long sleeves, pants, a wide-brimmed hat, and sunglasses year round, whenever you are outdoors.  Once a month, do a whole body skin exam, using a mirror to look at the skin on your back. Notify your caregiver of new moles, moles that have irregular borders, moles that are larger than a pencil eraser, or moles that have changed in shape or color.  Stay current with required immunizations.  Influenza. You need a dose every fall (or winter). The composition of the flu vaccine  changes each year, so being vaccinated once is not enough.  Pneumococcal polysaccharide. You need 1 to 2 doses if you smoke cigarettes or if you have certain chronic medical conditions. You need 1 dose at age 65 (or older) if you have never been vaccinated.  Tetanus, diphtheria, pertussis (Tdap, Td). Get 1 dose of Tdap vaccine if you are younger than age 65, are over 65 and have contact with an infant, are a healthcare worker, are pregnant, or simply want to be protected from whooping cough. After that, you need a Td   booster dose every 10 years. Consult your caregiver if you have not had at least 3 tetanus and diphtheria-containing shots sometime in your life or have a deep or dirty wound.  HPV. You need this vaccine if you are a woman age 26 or younger. The vaccine is given in 3 doses over 6 months.  Measles, mumps, rubella (MMR). You need at least 1 dose of MMR if you were born in 1957 or later. You may also need a second dose.  Meningococcal. If you are age 19 to 21 and a first-year college student living in a residence hall, or have one of several medical conditions, you need to get vaccinated against meningococcal disease. You may also need additional booster doses.  Zoster (shingles). If you are age 60 or older, you should get this vaccine.  Varicella (chickenpox). If you have never had chickenpox or you were vaccinated but received only 1 dose, talk to your caregiver to find out if you need this vaccine.  Hepatitis A. You need this vaccine if you have a specific risk factor for hepatitis A virus infection or you simply wish to be protected from this disease. The vaccine is usually given as 2 doses, 6 to 18 months apart.  Hepatitis B. You need this vaccine if you have a specific risk factor for hepatitis B virus infection or you simply wish to be protected from this disease. The vaccine is given in 3 doses, usually over 6 months. Preventive Services / Frequency Ages 19 to 39  Blood  pressure check.** / Every 1 to 2 years.  Lipid and cholesterol check.** / Every 5 years beginning at age 20.  Clinical breast exam.** / Every 3 years for women in their 20s and 30s.  Pap test.** / Every 2 years from ages 21 through 29. Every 3 years starting at age 30 through age 65 or 70 with a history of 3 consecutive normal Pap tests.  HPV screening.** / Every 3 years from ages 30 through ages 65 to 70 with a history of 3 consecutive normal Pap tests.  Hepatitis C blood test.** / For any individual with known risks for hepatitis C.  Skin self-exam. / Monthly.  Influenza immunization.** / Every year.  Pneumococcal polysaccharide immunization.** / 1 to 2 doses if you smoke cigarettes or if you have certain chronic medical conditions.  Tetanus, diphtheria, pertussis (Tdap, Td) immunization. / A one-time dose of Tdap vaccine. After that, you need a Td booster dose every 10 years.  HPV immunization. / 3 doses over 6 months, if you are 26 and younger.  Measles, mumps, rubella (MMR) immunization. / You need at least 1 dose of MMR if you were born in 1957 or later. You may also need a second dose.  Meningococcal immunization. / 1 dose if you are age 19 to 21 and a first-year college student living in a residence hall, or have one of several medical conditions, you need to get vaccinated against meningococcal disease. You may also need additional booster doses.  Varicella immunization.** / Consult your caregiver.  Hepatitis A immunization.** / Consult your caregiver. 2 doses, 6 to 18 months apart.  Hepatitis B immunization.** / Consult your caregiver. 3 doses usually over 6 months. Ages 40 to 64  Blood pressure check.** / Every 1 to 2 years.  Lipid and cholesterol check.** / Every 5 years beginning at age 20.  Clinical breast exam.** / Every year after age 40.  Mammogram.** / Every year beginning at age 40   and continuing for as long as you are in good health. Consult with your  caregiver.  Pap test.** / Every 3 years starting at age 30 through age 65 or 70 with a history of 3 consecutive normal Pap tests.  HPV screening.** / Every 3 years from ages 30 through ages 65 to 70 with a history of 3 consecutive normal Pap tests.  Fecal occult blood test (FOBT) of stool. / Every year beginning at age 50 and continuing until age 75. You may not need to do this test if you get a colonoscopy every 10 years.  Flexible sigmoidoscopy or colonoscopy.** / Every 5 years for a flexible sigmoidoscopy or every 10 years for a colonoscopy beginning at age 50 and continuing until age 75.  Hepatitis C blood test.** / For all people born from 1945 through 1965 and any individual with known risks for hepatitis C.  Skin self-exam. / Monthly.  Influenza immunization.** / Every year.  Pneumococcal polysaccharide immunization.** / 1 to 2 doses if you smoke cigarettes or if you have certain chronic medical conditions.  Tetanus, diphtheria, pertussis (Tdap, Td) immunization.** / A one-time dose of Tdap vaccine. After that, you need a Td booster dose every 10 years.  Measles, mumps, rubella (MMR) immunization. / You need at least 1 dose of MMR if you were born in 1957 or later. You may also need a second dose.  Varicella immunization.** / Consult your caregiver.  Meningococcal immunization.** / Consult your caregiver.  Hepatitis A immunization.** / Consult your caregiver. 2 doses, 6 to 18 months apart.  Hepatitis B immunization.** / Consult your caregiver. 3 doses, usually over 6 months. Ages 65 and over  Blood pressure check.** / Every 1 to 2 years.  Lipid and cholesterol check.** / Every 5 years beginning at age 20.  Clinical breast exam.** / Every year after age 40.  Mammogram.** / Every year beginning at age 40 and continuing for as long as you are in good health. Consult with your caregiver.  Pap test.** / Every 3 years starting at age 30 through age 65 or 70 with a 3  consecutive normal Pap tests. Testing can be stopped between 65 and 70 with 3 consecutive normal Pap tests and no abnormal Pap or HPV tests in the past 10 years.  HPV screening.** / Every 3 years from ages 30 through ages 65 or 70 with a history of 3 consecutive normal Pap tests. Testing can be stopped between 65 and 70 with 3 consecutive normal Pap tests and no abnormal Pap or HPV tests in the past 10 years.  Fecal occult blood test (FOBT) of stool. / Every year beginning at age 50 and continuing until age 75. You may not need to do this test if you get a colonoscopy every 10 years.  Flexible sigmoidoscopy or colonoscopy.** / Every 5 years for a flexible sigmoidoscopy or every 10 years for a colonoscopy beginning at age 50 and continuing until age 75.  Hepatitis C blood test.** / For all people born from 1945 through 1965 and any individual with known risks for hepatitis C.  Osteoporosis screening.** / A one-time screening for women ages 65 and over and women at risk for fractures or osteoporosis.  Skin self-exam. / Monthly.  Influenza immunization.** / Every year.  Pneumococcal polysaccharide immunization.** / 1 dose at age 65 (or older) if you have never been vaccinated.  Tetanus, diphtheria, pertussis (Tdap, Td) immunization. / A one-time dose of Tdap vaccine if you are over   65 and have contact with an infant, are a healthcare worker, or simply want to be protected from whooping cough. After that, you need a Td booster dose every 10 years.  Varicella immunization.** / Consult your caregiver.  Meningococcal immunization.** / Consult your caregiver.  Hepatitis A immunization.** / Consult your caregiver. 2 doses, 6 to 18 months apart.  Hepatitis B immunization.** / Check with your caregiver. 3 doses, usually over 6 months. ** Family history and personal history of risk and conditions may change your caregiver's recommendations. Document Released: 09/04/2001 Document Revised: 10/01/2011  Document Reviewed: 12/04/2010 [employer] Patient Information 2014 [employer].  

## 2013-01-07 NOTE — Progress Notes (Signed)
  Subjective:     Cassandra Wyatt is a 32 y.o. G0 female and is here for a comprehensive physical exam. The patient reports no problems. Using Nuvaring for birth control.  History   Social History  . Marital Status: Married    Spouse Name: N/A    Number of Children: N/A  . Years of Education: N/A   Occupational History  . Not on file.   Social History Main Topics  . Smoking status: Never Smoker   . Smokeless tobacco: Never Used  . Alcohol Use: No  . Drug Use: No  . Sexually Active: Yes -- Female partner(s)    Birth Control/ Protection: Other-see comments     Comment: Nuva Ring   Other Topics Concern  . Not on file   Social History Narrative  . No narrative on file   Health Maintenance  Topic Date Due  . Tetanus/tdap  07/04/2002  . Influenza Vaccine  03/23/2013  . Pap Smear  07/09/2014    The following portions of the patient's history were reviewed and updated as appropriate: allergies, current medications, past family history, past medical history, past social history, past surgical history and problem list.  Review of Systems Pertinent items are noted in HPI.   Objective:   BP 117/86  Pulse 85  Ht 5\' 11"  (1.803 m)  Wt 262 lb (118.842 kg)  BMI 36.56 kg/m2  LMP 12/25/2012 GENERAL: Well-developed, well-nourished female in no acute distress.  HEENT: Normocephalic, atraumatic. Sclerae anicteric.  NECK: Supple. Normal thyroid.  LUNGS: Clear to auscultation bilaterally.  HEART: Regular rate and rhythm. BREASTS: Symmetric in size. No masses, skin changes, nipple drainage, or lymphadenopathy. ABDOMEN: Soft, nontender, nondistended. No organomegaly. PELVIC: Normal external female genitalia. Vagina is pink and rugated.  Normal discharge. Normal cervix contour; significant cervical bleeding after pap.  Pap smear obtained. Uterus is normal in size. No adnexal mass or tenderness.  EXTREMITIES: No cyanosis, clubbing, or edema, 2+ distal pulses.   Assessment:    Healthy  female exam.  Plan:   Pap done, will follow up results and manage accordingly. Nuvaring prescribed Routine preventative health maintenance measures emphasized

## 2013-04-20 ENCOUNTER — Telehealth: Payer: Self-pay | Admitting: *Deleted

## 2013-04-20 MED ORDER — ETHYNODIOL DIAC-ETH ESTRADIOL 1-35 MG-MCG PO TABS: 1.0000 | ORAL_TABLET | Freq: Every day | ORAL | Status: DC

## 2013-04-20 NOTE — Telephone Encounter (Signed)
Patient would like rx for ocp called into pharmacy.  She is having trouble with the Nuva Ring staying in place lately.

## 2013-04-29 ENCOUNTER — Ambulatory Visit (INDEPENDENT_AMBULATORY_CARE_PROVIDER_SITE_OTHER): Payer: BC Managed Care – PPO | Admitting: Family Medicine

## 2013-04-29 ENCOUNTER — Encounter: Payer: Self-pay | Admitting: Family Medicine

## 2013-04-29 VITALS — BP 114/83 | HR 74 | Ht 71.0 in | Wt 255.0 lb

## 2013-04-29 DIAGNOSIS — N83209 Unspecified ovarian cyst, unspecified side: Secondary | ICD-10-CM

## 2013-04-29 DIAGNOSIS — N949 Unspecified condition associated with female genital organs and menstrual cycle: Secondary | ICD-10-CM

## 2013-04-29 DIAGNOSIS — Q628 Other congenital malformations of ureter: Secondary | ICD-10-CM

## 2013-04-29 DIAGNOSIS — Q625 Duplication of ureter: Secondary | ICD-10-CM | POA: Insufficient documentation

## 2013-04-29 NOTE — Assessment & Plan Note (Signed)
Probably physiologic, should resolved with time. Resume OC's.

## 2013-04-29 NOTE — Patient Instructions (Addendum)
Pelvic Pain Pelvic pain is pain below the belly button and located between your hips. Acute pain may last a few hours or days. Chronic pelvic pain may last weeks and months. The cause may be different for different types of pain. The pain may be dull or sharp, mild or severe and can interfere with your daily activities. Write down and tell your caregiver:   Exactly where the pain is located.  If it comes and goes or is there all the time.  When it happens (with sex, urination, bowel movement, etc.)  If the pain is related to your menstrual period or stress. Your caregiver will take a full history and do a complete physical exam and Pap test. CAUSES   Painful menstrual periods (dysmenorrhea).  Normal ovulation (Mittelschmertz) that occurs in the middle of the menstrual cycle every month.  The pelvic organs get engorged with blood just before the menstrual period (pelvic congestive syndrome).  Scar tissue from an infection or past surgery (pelvic adhesions).  Cancer of the female pelvic organs. When there is pain with cancer, it has been there for a long time.  The lining of the uterus (endometrium) abnormally grows in places like the pelvis and on the pelvic organs (endometriosis).  A form of endometriosis with the lining of the uterus present inside of the muscle tissue of the uterus (adenomyosis).  Fibroid tumor (noncancerous) in the uterus.  Bladder problems such as infection, bladder spasms of the muscle tissue of the bladder.  Intestinal problems (irritable bowel syndrome, colitis, an ulcer or gastrointestinal infection).  Polyps of the cervix or uterus.  Pregnancy in the tube (ectopic pregnancy).  The opening of the cervix is too small for the menstrual blood to flow through it (cervical stenosis).  Physical or sexual abuse (past or present).  Musculo-skeletal problems from poor posture, problems with the vertebrae of the lower back or the uterine pelvic muscles falling  (prolapse).  Psychological problems such as depression or stress.  IUD (intrauterine device) in the uterus. DIAGNOSIS  Tests to make a diagnosis depends on the type, location, severity and what causes the pain to occur. Tests that may be needed include:  Blood tests.  Urine tests  Ultrasound.  X-rays.  CT Scan.  MRI.  Laparoscopy.  Major surgery. TREATMENT  Treatment will depend on the cause of the pain, which includes:  Prescription or over-the-counter pain medication.  Antibiotics.  Birth control pills.  Hormone treatment.  Nerve blocking injections.  Physical therapy.  Antidepressants.  Counseling with a psychiatrist or psychologist.  Minor or major surgery. HOME CARE INSTRUCTIONS   Only take over-the-counter or prescription medicines for pain, discomfort or fever as directed by your caregiver.  Follow your caregiver's advice to treat your pain.  Rest.  Avoid sexual intercourse if it causes the pain.  Apply warm or cold compresses (which ever works best) to the pain area.  Do relaxation exercises such as yoga or meditation.  Try acupuncture.  Avoid stressful situations.  Try group therapy.  If the pain is because of a stomach/intestinal upset, drink clear liquids, eat a bland light food diet until the symptoms go away. SEEK MEDICAL CARE IF:   You need stronger prescription pain medication.  You develop pain with sexual intercourse.  You have pain with urination.  You develop a temperature of 102 F (38.9 C) with the pain.  You are still in pain after 4 hours of taking prescription medication for the pain.  You need depression medication.    Your IUD is causing pain and you want it removed. SEEK IMMEDIATE MEDICAL CARE IF:  You develop very severe pain or tenderness.  You faint, have chills, severe weakness or dehydration.  You develop heavy vaginal bleeding or passing solid tissue.  You develop a temperature of 102 F (38.9 C)  with the pain.  You have blood in the urine.  You are being physically or sexually abused.  You have uncontrolled vomiting and diarrhea.  You are depressed and afraid of harming yourself or someone else. Document Released: 08/16/2004 Document Revised: 10/01/2011 Document Reviewed: 05/13/2008 [facility] Patient Information 2013 [place], [place]. Ovarian Cyst The ovaries are small organs that are on each side of the uterus. The ovaries are the organs that produce the female hormones, estrogen and progesterone. An ovarian cyst is a sac filled with fluid that can vary in its size. It is normal for a small cyst to form in women who are in the childbearing age and who have menstrual periods. This type of cyst is called a follicle cyst that becomes an ovulation cyst (corpus luteum cyst) after it produces the women's egg. It later goes away on its own if the woman does not become pregnant. There are other kinds of ovarian cysts that may cause problems and may need to be treated. The most serious problem is a cyst with cancer. It should be noted that menopausal women who have an ovarian cyst are at a higher risk of it being a cancer cyst. They should be evaluated very quickly, thoroughly and followed closely. This is especially true in menopausal women because of the high rate of ovarian cancer in women in menopause. CAUSES AND TYPES OF OVARIAN CYSTS:  FUNCTIONAL CYST: The follicle/corpus luteum cyst is a functional cyst that occurs every month during ovulation with the menstrual cycle. They go away with the next menstrual cycle if the woman does not get pregnant. Usually, there are no symptoms with a functional cyst.  ENDOMETRIOMA CYST: This cyst develops from the lining of the uterus tissue. This cyst gets in or on the ovary. It grows every month from the bleeding during the menstrual period. It is also called a "chocolate cyst" because it becomes filled with blood that turns brown. This cyst can cause pain  in the lower abdomen during intercourse and with your menstrual period.  CYSTADENOMA CYST: This cyst develops from the cells on the outside of the ovary. They usually are not cancerous. They can get very big and cause lower abdomen pain and pain with intercourse. This type of cyst can twist on itself, cut off its blood supply and cause severe pain. It also can easily rupture and cause a lot of pain.  DERMOID CYST: This type of cyst is sometimes found in both ovaries. They are found to have different kinds of body tissue in the cyst. The tissue includes skin, teeth, hair, and/or cartilage. They usually do not have symptoms unless they get very big. Dermoid cysts are rarely cancerous.  POLYCYSTIC OVARY: This is a rare condition with hormone problems that produces many small cysts on both ovaries. The cysts are follicle-like cysts that never produce an egg and become a corpus luteum. It can cause an increase in body weight, infertility, acne, increase in body and facial hair and lack of menstrual periods or rare menstrual periods. Many women with this problem develop type 2 diabetes. The exact cause of this problem is unknown. A polycystic ovary is rarely cancerous.  THECA LUTEIN CYST: Occurs  when too much hormone (human chorionic gonadotropin) is produced and over-stimulates the ovaries to produce an egg. They are frequently seen when doctors stimulate the ovaries for invitro-fertilization (test tube babies).  LUTEOMA CYST: This cyst is seen during pregnancy. Rarely it can cause an obstruction to the birth canal during labor and delivery. They usually go away after delivery. SYMPTOMS   Pelvic pain or pressure.  Pain during sexual intercourse.  Increasing girth (swelling) of the abdomen.  Abnormal menstrual periods.  Increasing pain with menstrual periods.  You stop having menstrual periods and you are not pregnant. DIAGNOSIS  The diagnosis can be made during:  Routine or annual pelvic  examination (common).  Ultrasound.  X-ray of the pelvis.  CT Scan.  MRI.  Blood tests. TREATMENT   Treatment may only be to follow the cyst monthly for 2 to 3 months with your caregiver. Many go away on their own, especially functional cysts.  May be aspirated (drained) with a long needle with ultrasound, or by laparoscopy (inserting a tube into the pelvis through a small incision).  The whole cyst can be removed by laparoscopy.  Sometimes the cyst may need to be removed through an incision in the lower abdomen.  Hormone treatment is sometimes used to help dissolve certain cysts.  Birth control pills are sometimes used to help dissolve certain cysts. HOME CARE INSTRUCTIONS  Follow your caregiver's advice regarding:  Medicine.  Follow up visits to evaluate and treat the cyst.  You may need to come back or make an appointment with another caregiver, to find the exact cause of your cyst, if your caregiver is not a gynecologist.  Get your yearly and recommended pelvic examinations and Pap tests.  Let your caregiver know if you have had an ovarian cyst in the past. SEEK MEDICAL CARE IF:   Your periods are late, irregular, they stop, or are painful.  Your stomach (abdomen) or pelvic pain does not go away.  Your stomach becomes larger or swollen.  You have pressure on your bladder or trouble emptying your bladder completely.  You have painful sexual intercourse.  You have feelings of fullness, pressure, or discomfort in your stomach.  You lose weight for no apparent reason.  You feel generally ill.  You become constipated.  You lose your appetite.  You develop acne.  You have an increase in body and facial hair.  You are gaining weight, without changing your exercise and eating habits.  You think you are pregnant. SEEK IMMEDIATE MEDICAL CARE IF:   You have increasing abdominal pain.  You feel sick to your stomach (nausea) and/or vomit.  You develop a  fever that comes on suddenly.  You develop abdominal pain during a bowel movement.  Your menstrual periods become heavier than usual. Document Released: 07/09/2005 Document Revised: 10/01/2011 Document Reviewed: 05/12/2009 [facility] Patient Information 2014 [place], [place].

## 2013-04-29 NOTE — Progress Notes (Signed)
  Subjective:    Patient ID: Cassandra Wyatt, female    DOB: 12-28-1980, 32 y.o.   MRN: [id number]  Vaginal Bleeding The patient's primary symptoms include pelvic pain. The patient's pertinent negatives include no vaginal discharge. Pertinent negatives include no dysuria, fever, nausea or vomiting.  Pelvic Pain The patient's primary symptoms include pelvic pain. The patient's pertinent negatives include no vaginal discharge. Pertinent negatives include no dysuria, fever, nausea or vomiting.    Pt. On OC's for a long time secondary to dysmenorrhea.  Switched to [employer] and desired to switch back secondary to [employer] coming out too frequently.  This month had no hormonal influence and at ovulation developed some spotting and left sided abdominal pain.  Improved with Aleve and Ibuprofen.    Review of Systems  Constitutional: Negative for fever.  Gastrointestinal: Negative for nausea and vomiting.  Endocrine: Negative for polydipsia.  Genitourinary: Positive for vaginal bleeding and pelvic pain. Negative for dysuria and vaginal discharge.       Objective:   Physical Exam  Vitals reviewed. Constitutional: She appears well-developed and well-nourished.  HENT:  Head: Normocephalic and atraumatic.  Eyes: No scleral icterus.  Neck: Neck supple.  Cardiovascular: Normal rate.   Pulmonary/Chest: Effort normal.  Abdominal: Soft. There is no tenderness.  Genitourinary: Uterus normal. Right adnexum displays no mass and no tenderness. Left adnexum displays tenderness. No vaginal discharge found.    Pelvic sonogram reveals a small simple ovarian cyst on left approx. 2 x 1.6 x 1.4.      Assessment & Plan:

## 2013-09-14 ENCOUNTER — Encounter: Payer: Self-pay | Admitting: Obstetrics & Gynecology

## 2013-09-14 ENCOUNTER — Ambulatory Visit (INDEPENDENT_AMBULATORY_CARE_PROVIDER_SITE_OTHER): Payer: BC Managed Care – PPO | Admitting: Obstetrics & Gynecology

## 2013-09-14 VITALS — BP 116/77 | HR 79 | Ht 71.0 in | Wt 237.0 lb

## 2013-09-14 DIAGNOSIS — N949 Unspecified condition associated with female genital organs and menstrual cycle: Secondary | ICD-10-CM

## 2013-09-14 DIAGNOSIS — R102 Pelvic and perineal pain: Principal | ICD-10-CM

## 2013-09-14 DIAGNOSIS — G8929 Other chronic pain: Secondary | ICD-10-CM

## 2013-09-14 MED ORDER — NORELGESTROMIN-ETH ESTRADIOL 150-35 MCG/24HR TD PTWK: 1.0000 | MEDICATED_PATCH | TRANSDERMAL | Status: DC

## 2013-09-14 NOTE — Progress Notes (Signed)
   Subjective:    Patient ID: Cassandra LlamasLisa M Gebauer, female    DOB: [birth year], 33 y.o.   MRN: [id number]  HPI  33 yo SW G0 here for follow up of her pelvic pain. She had a bedside u/s here at the office 10/14 which showed a 2/4 cm simple left ovarian cyst. She restarted OCPs then. The OCP Zovia has caused her great mood swings with crying. She would like to try the patch. She also says that her pain is not improved. In fact, at work she had such a sudden bad pain that she had to take her mom's vicodin.  Review of Systems     Objective:   Physical Exam  NSSR, minimally mobile, no masses or intense pain appreciated      Assessment & Plan:  Pelvic pain- check u/s and CT/GC cultures Contraception- change to ortho Evra Rec start MVI daily

## 2013-09-15 LAB — GC/CHLAMYDIA PROBE AMP
CT Probe RNA: NEGATIVE
GC PROBE AMP APTIMA: NEGATIVE

## 2013-09-30 ENCOUNTER — Ambulatory Visit (HOSPITAL_COMMUNITY): Payer: BC Managed Care – PPO

## 2013-10-05 ENCOUNTER — Ambulatory Visit (HOSPITAL_COMMUNITY)
Admission: RE | Admit: 2013-10-05 | Discharge: 2013-10-05 | Disposition: A | Discharge status: Home / Self Care | Payer: BC Managed Care – PPO | Source: Ambulatory Visit | Attending: Obstetrics & Gynecology | Admitting: Obstetrics & Gynecology

## 2013-10-05 DIAGNOSIS — N938 Other specified abnormal uterine and vaginal bleeding: Secondary | ICD-10-CM | POA: Insufficient documentation

## 2013-10-05 DIAGNOSIS — G8929 Other chronic pain: Secondary | ICD-10-CM

## 2013-10-05 DIAGNOSIS — R1032 Left lower quadrant pain: Secondary | ICD-10-CM | POA: Insufficient documentation

## 2013-10-05 DIAGNOSIS — N925 Other specified irregular menstruation: Secondary | ICD-10-CM | POA: Insufficient documentation

## 2013-10-05 DIAGNOSIS — N83209 Unspecified ovarian cyst, unspecified side: Secondary | ICD-10-CM | POA: Insufficient documentation

## 2013-10-05 DIAGNOSIS — N949 Unspecified condition associated with female genital organs and menstrual cycle: Secondary | ICD-10-CM | POA: Insufficient documentation

## 2013-10-05 DIAGNOSIS — R102 Pelvic and perineal pain: Secondary | ICD-10-CM

## 2013-10-07 ENCOUNTER — Encounter: Payer: Self-pay | Admitting: Obstetrics & Gynecology

## 2013-10-07 ENCOUNTER — Ambulatory Visit (INDEPENDENT_AMBULATORY_CARE_PROVIDER_SITE_OTHER): Payer: BC Managed Care – PPO | Admitting: Obstetrics & Gynecology

## 2013-10-07 VITALS — BP 117/88 | HR 69 | Ht 71.0 in | Wt 236.0 lb

## 2013-10-07 DIAGNOSIS — N949 Unspecified condition associated with female genital organs and menstrual cycle: Secondary | ICD-10-CM

## 2013-10-07 DIAGNOSIS — G8929 Other chronic pain: Secondary | ICD-10-CM

## 2013-10-07 DIAGNOSIS — R102 Pelvic and perineal pain: Principal | ICD-10-CM

## 2013-10-07 NOTE — Progress Notes (Signed)
   Subjective:    Patient ID: Lenice LlamasLisa M Gebauer, female    DOB: 13-Jan-1981, 33 y.o.   MRN: [id number]  HPI 33 yo G0 with a h/o pelvic pain that is suspicious for endometriosis. She started on the Ortho Evra patch and says that at least for now it is helping. I wrote it for continuous use, but her insurance will only let her refill it once per month. I offered to give her samples of OCPs to use during the off week prn. Her u/s was normal.   Review of Systems     Objective:   Physical Exam        Assessment & Plan:  As above

## 2013-11-09 ENCOUNTER — Ambulatory Visit (INDEPENDENT_AMBULATORY_CARE_PROVIDER_SITE_OTHER): Payer: BC Managed Care – PPO | Admitting: Family Medicine

## 2013-11-09 VITALS — BP 100/60 | HR 81 | Temp 98.3°F | Resp 16 | Ht 72.0 in | Wt 235.0 lb

## 2013-11-09 DIAGNOSIS — J329 Chronic sinusitis, unspecified: Secondary | ICD-10-CM

## 2013-11-09 MED ORDER — SULFAMETHOXAZOLE-TMP DS 800-160 MG PO TABS: 1.0000 | ORAL_TABLET | Freq: Two times a day (BID) | ORAL | Status: DC

## 2013-11-09 NOTE — Patient Instructions (Signed)

## 2013-11-09 NOTE — Progress Notes (Signed)
° °  Subjective:    Patient ID: Cassandra Wyatt, female    DOB: February 27, 1981, 33 y.o.   MRN: [id number]  HPI This chart was scribed for Elvina SidleKurt Lauenstein, MD by Charline BillsEssence Howell, ED Scribe. The patient was seen in room 4. Patient's care was started at 8:53 AM.  HPI Comments: Cassandra Wyatt is a 33 y.o. female who presents to the [facility] complaining of bilateral ear pain and sore throat with swallowing onset yesterday. She also reports associated rhinorrhea specifically in her R nostril. Pt states that she had to place tissue in her nostril last night to sleep. She denies cough.   Pt states that she has been using the patch for 2 months and has noticed improvement. She still reports breakthrough bleeding.  Pt works at [employer]. She just completed week 4 of a 10 week training.   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   Respiratory: Negative for cough.        Objective:   Physical Exam        Assessment & Plan:   1. Sinusitis      I personally performed the services described in this documentation, which was scribed in my presence. The recorded information has been reviewed and is accurate.  Sinusitis - Plan: sulfamethoxazole-trimethoprim (BACTRIM DS) 800-160 MG per tablet  Signed, Elvina SidleKurt Lauenstein, MD

## 2013-12-29 ENCOUNTER — Ambulatory Visit (INDEPENDENT_AMBULATORY_CARE_PROVIDER_SITE_OTHER): Payer: BC Managed Care – PPO | Admitting: Obstetrics & Gynecology

## 2013-12-29 ENCOUNTER — Encounter: Payer: Self-pay | Admitting: Obstetrics & Gynecology

## 2013-12-29 VITALS — BP 128/85 | HR 67 | Ht 72.0 in | Wt 230.0 lb

## 2013-12-29 DIAGNOSIS — Z01419 Encounter for gynecological examination (general) (routine) without abnormal findings: Secondary | ICD-10-CM

## 2013-12-29 DIAGNOSIS — Z113 Encounter for screening for infections with a predominantly sexual mode of transmission: Secondary | ICD-10-CM

## 2013-12-29 DIAGNOSIS — Z124 Encounter for screening for malignant neoplasm of cervix: Secondary | ICD-10-CM

## 2013-12-29 DIAGNOSIS — Z1151 Encounter for screening for human papillomavirus (HPV): Secondary | ICD-10-CM

## 2013-12-29 DIAGNOSIS — Z0000 Encounter for general adult medical examination without abnormal findings: Secondary | ICD-10-CM

## 2013-12-29 MED ORDER — ETONOGESTREL-ETHINYL ESTRADIOL 0.12-0.015 MG/24HR VA RING: VAGINAL_RING | VAGINAL | Status: DC

## 2013-12-29 NOTE — Progress Notes (Signed)
Subjective:    Cassandra Wyatt is a 33 y.o. female who presents for an annual exam. She wants to switch back to Nuvaring because her insurance won't pay for the patches. She removes the ring with sex because it hurts if left in. She reports that her pelvic pain has improved "immensely" The patient has no complaints today. The patient is sexually active. GYN screening history: last pap: was normal. The patient wears seatbelts: yes. The patient participates in regular exercise: no. Has the patient ever been transfused or tattooed?: yes. The patient reports that there is not domestic violence in her life.   Menstrual History: OB History   Grav Para Term Preterm Abortions TAB SAB Ect Mult Living   0               Menarche age: 28  No LMP recorded. Patient is not currently having periods (Reason: Oral contraceptives).    The following portions of the patient's history were reviewed and updated as appropriate: allergies, current medications, past family history, past medical history, past social history, past surgical history and problem list.  Review of Systems A comprehensive review of systems was negative.    Objective:    BP 128/85  Pulse 67  Ht 6' (1.829 m)  Wt 230 lb (104.327 kg)  BMI 31.19 kg/m2  General Appearance:    Alert, cooperative, no distress, appears stated age  Head:    Normocephalic, without obvious abnormality, atraumatic  Eyes:    PERRL, conjunctiva/corneas clear, EOM's intact, fundi    benign, both eyes  Ears:    Normal TM's and external ear canals, both ears  Nose:   Nares normal, septum midline, mucosa normal, no drainage    or sinus tenderness  Throat:   Lips, mucosa, and tongue normal; teeth and gums normal  Neck:   Supple, symmetrical, trachea midline, no adenopathy;    thyroid:  no enlargement/tenderness/nodules; no carotid   bruit or JVD  Back:     Symmetric, no curvature, ROM normal, no CVA tenderness  Lungs:     Clear to auscultation bilaterally,  respirations unlabored  Chest Wall:    No tenderness or deformity   Heart:    Regular rate and rhythm, S1 and S2 normal, no murmur, rub   or gallop  Breast Exam:    No tenderness, masses, or nipple abnormality  Abdomen:     Soft, non-tender, bowel sounds active all four quadrants,    no masses, no organomegaly  Genitalia:    Normal female without lesion, discharge or tenderness, NSSA, NT, mobile, normal adnexal exam     Extremities:   Extremities normal, atraumatic, no cyanosis or edema  Pulses:   2+ and symmetric all extremities  Skin:   Skin color, texture, turgor normal, no rashes or lesions  Lymph nodes:   Cervical, supraclavicular, and axillary nodes normal  Neurologic:   CNII-XII intact, normal strength, sensation and reflexes    throughout  .    Assessment:    Healthy female exam.    Plan:     Breast self exam technique reviewed and patient encouraged to perform self-exam monthly. Thin prep Pap smear. with cotesting

## 2014-01-01 LAB — CYTOLOGY - PAP

## 2014-01-15 ENCOUNTER — Ambulatory Visit (INDEPENDENT_AMBULATORY_CARE_PROVIDER_SITE_OTHER): Payer: BC Managed Care – PPO | Admitting: Family Medicine

## 2014-01-15 VITALS — BP 116/60 | HR 79 | Temp 98.2°F | Resp 16 | Ht 70.5 in | Wt 227.0 lb

## 2014-01-15 DIAGNOSIS — G43009 Migraine without aura, not intractable, without status migrainosus: Secondary | ICD-10-CM

## 2014-01-15 DIAGNOSIS — Z0000 Encounter for general adult medical examination without abnormal findings: Secondary | ICD-10-CM

## 2014-01-15 LAB — CBC
HCT: 40.7 % (ref 36.0–46.0)
Hemoglobin: 13.7 g/dL (ref 12.0–15.0)
MCH: 28.8 pg (ref 26.0–34.0)
MCHC: 33.7 g/dL (ref 30.0–36.0)
MCV: 85.5 fL (ref 78.0–100.0)
Platelets: 349 10*3/uL (ref 150–400)
RBC: 4.76 MIL/uL (ref 3.87–5.11)
RDW: 13 % (ref 11.5–15.5)
WBC: 8.1 10*3/uL (ref 4.0–10.5)

## 2014-01-15 LAB — LIPID PANEL
Cholesterol: 211 mg/dL — ABNORMAL HIGH (ref 0–200)
HDL: 56 mg/dL (ref >39)
LDL Cholesterol: 129 mg/dL — ABNORMAL HIGH (ref 0–99)
Total CHOL/HDL Ratio: 3.8 Ratio
Triglycerides: 128 mg/dL (ref <150)
VLDL: 26 mg/dL (ref 0–40)

## 2014-01-15 LAB — COMPREHENSIVE METABOLIC PANEL
ALT: 10 U/L (ref 0–35)
AST: 8 U/L (ref 0–37)
Albumin: 4 g/dL (ref 3.5–5.2)
Alkaline Phosphatase: 72 U/L (ref 39–117)
BUN: 10 mg/dL (ref 6–23)
CO2: 24 mEq/L (ref 19–32)
Calcium: 8.9 mg/dL (ref 8.4–10.5)
Chloride: 106 mEq/L (ref 96–112)
Creat: 0.74 mg/dL (ref 0.50–1.10)
Glucose, Bld: 90 mg/dL (ref 70–99)
Potassium: 4.2 mEq/L (ref 3.5–5.3)
Sodium: 139 mEq/L (ref 135–145)
Total Bilirubin: 0.6 mg/dL (ref 0.2–1.2)
Total Protein: 6.6 g/dL (ref 6.0–8.3)

## 2014-01-15 LAB — POCT UA - MICROSCOPIC ONLY
Casts, Ur, LPF, POC: NEGATIVE
Crystals, Ur, HPF, POC: NEGATIVE
Mucus, UA: POSITIVE
Yeast, UA: NEGATIVE

## 2014-01-15 LAB — POCT URINALYSIS DIPSTICK
Bilirubin, UA: NEGATIVE
Blood, UA: NEGATIVE
Glucose, UA: NEGATIVE
Ketones, UA: NEGATIVE
Leukocytes, UA: NEGATIVE
Nitrite, UA: NEGATIVE
Spec Grav, UA: 1.025
Urobilinogen, UA: 0.2
pH, UA: 5

## 2014-01-15 MED ORDER — TOPIRAMATE 50 MG PO TABS: 50.0000 mg | ORAL_TABLET | Freq: Two times a day (BID) | ORAL | Status: DC

## 2014-01-15 NOTE — Patient Instructions (Signed)
Look into Cassandra Wyatt, located in [place], [place]   Migraine Headache A migraine headache is an intense, throbbing pain on one or both sides of your head. A migraine can last for 30 minutes to several hours. CAUSES  The exact cause of a migraine headache is not always known. However, a migraine may be caused when nerves in the brain become irritated and release chemicals that cause inflammation. This causes pain. Certain things may also trigger migraines, such as:  Alcohol.  Smoking.  Stress.  Menstruation.  Aged cheeses.  Foods or drinks that contain nitrates, glutamate, aspartame, or tyramine.  Lack of sleep.  Chocolate.  Caffeine.  Hunger.  Physical exertion.  Fatigue.  Medicines used to treat chest pain (nitroglycerine), birth control pills, estrogen, and some blood pressure medicines. SIGNS AND SYMPTOMS  Pain on one or both sides of your head.  Pulsating or throbbing pain.  Severe pain that prevents daily activities.  Pain that is aggravated by any physical activity.  Nausea, vomiting, or both.  Dizziness.  Pain with exposure to bright lights, loud noises, or activity.  General sensitivity to bright lights, loud noises, or smells. Before you get a migraine, you may get warning signs that a migraine is coming (aura). An aura may include:  Seeing flashing lights.  Seeing bright spots, halos, or zig-zag lines.  Having tunnel vision or blurred vision.  Having feelings of numbness or tingling.  Having trouble talking.  Having muscle weakness. DIAGNOSIS  A migraine headache is often diagnosed based on:  Symptoms.  Physical exam.  A CT scan or MRI of your head. These imaging tests cannot diagnose migraines, but they can help rule out other causes of headaches. TREATMENT Medicines may be given for pain and nausea. Medicines can also be given to help prevent recurrent migraines.  HOME CARE INSTRUCTIONS  Only take over-the-counter or prescription  medicines for pain or discomfort as directed by your health care provider. The use of long-term narcotics is not recommended.  Lie down in a dark, quiet room when you have a migraine.  Keep a journal to find out what may trigger your migraine headaches. For example, write down:  What you eat and drink.  How much sleep you get.  Any change to your diet or medicines.  Limit alcohol consumption.  Quit smoking if you smoke.  Get 7-9 hours of sleep, or as recommended by your health care provider.  Limit stress.  Keep lights dim if bright lights bother you and make your migraines worse. SEEK IMMEDIATE MEDICAL CARE IF:   Your migraine becomes severe.  You have a fever.  You have a stiff neck.  You have vision loss.  You have muscular weakness or loss of muscle control.  You start losing your balance or have trouble walking.  You feel faint or pass out.  You have severe symptoms that are different from your first symptoms. MAKE SURE YOU:   Understand these instructions.  Will watch your condition.  Will get help right away if you are not doing well or get worse. Document Released: 07/09/2005 Document Revised: 04/29/2013 Document Reviewed: 03/16/2013 [facility] Patient Information 2015 [place], [place]. This information is not intended to replace advice given to you by your health care provider. Make sure you discuss any questions you have with your health care provider. han, located in [place], [place]   Health Maintenance, Female A healthy lifestyle and preventative care can promote health and wellness.  Maintain regular health, dental, and eye exams.  Eat a healthy diet. Foods like vegetables, fruits, whole grains, low-fat dairy products, and lean protein foods contain the nutrients you need without too many calories. Decrease your intake of foods high in solid fats, added sugars, and salt. Get information about a proper diet from your caregiver, if necessary.  Regular  physical exercise is one of the most important things you can do for your health. Most adults should get at least 150 minutes of moderate-intensity exercise (any activity that increases your heart rate and causes you to sweat) each week. In addition, most adults need muscle-strengthening exercises on 2 or more days a week.   Maintain a healthy weight. The body mass index (BMI) is a screening tool to identify possible weight problems. It provides an estimate of body fat based on height and weight. Your caregiver can help determine your BMI, and can help you achieve or maintain a healthy weight. For adults 20 years and older:  A BMI below 18.5 is considered underweight.  A BMI of 18.5 to 24.9 is normal.  A BMI of 25 to 29.9 is considered overweight.  A BMI of 30 and above is considered obese.  Maintain normal blood lipids and cholesterol by exercising and minimizing your intake of saturated fat. Eat a balanced diet with plenty of fruits and vegetables. Blood tests for lipids and cholesterol should begin at age 59 and be repeated every 5 years. If your lipid or cholesterol levels are high, you are over 50, or you are a high risk for heart disease, you may need your cholesterol levels checked more frequently.Ongoing high lipid and cholesterol levels should be treated with medicines if diet and exercise are not effective.  If you smoke, find out from your caregiver how to quit. If you do not use tobacco, do not start.  Lung cancer screening is recommended for adults aged 49-80 years who are at high risk for developing lung cancer because of a history of smoking. Yearly low-dose computed tomography (CT) is recommended for people who have at least a 30-pack-year history of smoking and are a current smoker or have quit within the past 15 years. A pack year of smoking is smoking an average of 1 pack of cigarettes a day for 1 year (for example: 1 pack a day for 30 years or 2 packs a day for 15 years). Yearly  screening should continue until the smoker has stopped smoking for at least 15 years. Yearly screening should also be stopped for people who develop a health problem that would prevent them from having lung cancer treatment.  If you are pregnant, do not drink alcohol. If you are breastfeeding, be very cautious about drinking alcohol. If you are not pregnant and choose to drink alcohol, do not exceed 1 drink per day. One drink is considered to be 12 ounces (355 mL) of beer, 5 ounces (148 mL) of wine, or 1.5 ounces (44 mL) of liquor.  Avoid use of street drugs. Do not share needles with anyone. Ask for help if you need support or instructions about stopping the use of drugs.  High blood pressure causes heart disease and increases the risk of stroke. Blood pressure should be checked at least every 1 to 2 years. Ongoing high blood pressure should be treated with medicines, if weight loss and exercise are not effective.  If you are 59 to 31 years old, ask your caregiver if you should take aspirin to prevent strokes.  Diabetes screening involves taking a blood sample to  check your fasting blood sugar level. This should be done once every 3 years, after age 74, if you are within normal weight and without risk factors for diabetes. Testing should be considered at a younger age or be carried out more frequently if you are overweight and have at least 1 risk factor for diabetes.  Breast cancer screening is essential preventative care for women. You should practice "breast self-awareness." This means understanding the normal appearance and feel of your breasts and may include breast self-examination. Any changes detected, no matter how small, should be reported to a caregiver. Women in their 87s and 30s should have a clinical breast exam (CBE) by a caregiver as part of a regular health exam every 1 to 3 years. After age 88, women should have a CBE every year. Starting at age 65, women should consider having a  mammogram (breast X-ray) every year. Women who have a family history of breast cancer should talk to their caregiver about genetic screening. Women at a high risk of breast cancer should talk to their caregiver about having an MRI and a mammogram every year.  Breast cancer gene (BRCA)-related cancer risk assessment is recommended for women who have family members with BRCA-related cancers. BRCA-related cancers include breast, ovarian, tubal, and peritoneal cancers. Having family members with these cancers may be associated with an increased risk for harmful changes (mutations) in the breast cancer genes BRCA1 and BRCA2. Results of the assessment will determine the need for genetic counseling and BRCA1 and BRCA2 testing.  The Pap test is a screening test for cervical cancer. Women should have a Pap test starting at age 67. Between ages 109 and 53, Pap tests should be repeated every 2 years. Beginning at age 65, you should have a Pap test every 3 years as long as the past 3 Pap tests have been normal. If you had a hysterectomy for a problem that was not cancer or a condition that could lead to cancer, then you no longer need Pap tests. If you are between ages 62 and 54, and you have had normal Pap tests going back 10 years, you no longer need Pap tests. If you have had past treatment for cervical cancer or a condition that could lead to cancer, you need Pap tests and screening for cancer for at least 20 years after your treatment. If Pap tests have been discontinued, risk factors (such as a new sexual partner) need to be reassessed to determine if screening should be resumed. Some women have medical problems that increase the chance of getting cervical cancer. In these cases, your caregiver may recommend more frequent screening and Pap tests.  The human papillomavirus (HPV) test is an additional test that may be used for cervical cancer screening. The HPV test looks for the virus that can cause the cell changes  on the cervix. The cells collected during the Pap test can be tested for HPV. The HPV test could be used to screen women aged 58 years and older, and should be used in women of any age who have unclear Pap test results. After the age of 50, women should have HPV testing at the same frequency as a Pap test.  Colorectal cancer can be detected and often prevented. Most routine colorectal cancer screening begins at the age of 31 and continues through age 62. However, your caregiver may recommend screening at an earlier age if you have risk factors for colon cancer. On a yearly basis, your caregiver may provide  home test kits to check for hidden blood in the stool. Use of a small camera at the end of a tube, to directly examine the colon (sigmoidoscopy or colonoscopy), can detect the earliest forms of colorectal cancer. Talk to your caregiver about this at age 69, when routine screening begins. Direct examination of the colon should be repeated every 5 to 10 years through age 38, unless early forms of pre-cancerous polyps or small growths are found.  Hepatitis C blood testing is recommended for all people born from [birth year] through 1963 and any individual with known risks for hepatitis C.  Practice safe sex. Use condoms and avoid high-risk sexual practices to reduce the spread of sexually transmitted infections (STIs). Sexually active women aged 39 and younger should be checked for Chlamydia, which is a common sexually transmitted infection. Older women with new or multiple partners should also be tested for Chlamydia. Testing for other STIs is recommended if you are sexually active and at increased risk.  Osteoporosis is a disease in which the bones lose minerals and strength with aging. This can result in serious bone fractures. The risk of osteoporosis can be identified using a bone density scan. Women ages 41 and over and women at risk for fractures or osteoporosis should discuss screening with their caregivers.  Ask your caregiver whether you should be taking a calcium supplement or vitamin D to reduce the rate of osteoporosis.  Menopause can be associated with physical symptoms and risks. Hormone replacement therapy is available to decrease symptoms and risks. You should talk to your caregiver about whether hormone replacement therapy is right for you.  Use sunscreen. Apply sunscreen liberally and repeatedly throughout the day. You should seek shade when your shadow is shorter than you. Protect yourself by wearing long sleeves, pants, a wide-brimmed hat, and sunglasses year round, whenever you are outdoors.  Notify your caregiver of new moles or changes in moles, especially if there is a change in shape or color. Also notify your caregiver if a mole is larger than the size of a pencil eraser.  Stay current with your immunizations. Document Released: 01/22/2011 Document Revised: 11/03/2012 Document Reviewed: 06/10/2013 [facility] Patient Information 2015 [place], [place]. This information is not intended to replace advice given to you by your health care provider. Make sure you discuss any questions you have with your health care provider.

## 2014-01-15 NOTE — Progress Notes (Signed)
Subjective:    Patient ID: Cassandra Wyatt, female    DOB: Mar 29, 1981, 33 y.o.   MRN: [id number]  HPI Chief Complaint  Patient presents with   Annual Exam    no pap   Facial Pain    x 30 days sinus  . This chart was scribed for Elvina SidleKurt Lauenstein, MD by Andrew Auaven Small, ED Scribe. This patient was seen in room 9 and the patient's care was started at 8:39 AM.  HPI Comments: Cassandra Wyatt is a 33 y.o. female who presents to the [facility] complaining of annual exam. Pt reports last pelvic exam was within the last months. States she stays on top of this of this due to removal of cancerous cells in the past. [facility]   Pt also complains of intermittent left facial pain x 1 year but have worsened in the last month with associated photophobia, HA and left eye pain. Pt reports she has HA 1-2 times a week.  She reports at time facial pain causes emesis. Pt reports h/o of migraine.  Pt reports 2 broken lower wisdom teeth and is requesting a referral to dentist.    States she had joint stiffness in left knee. Reports she drives a manual car and has popping and stiffness in her left knee when shift knees. Pt also has neck stiffness when waking in the morning. She states she sleeps on an orthopedic pillow but states it makes pain worse.    Pt works at [employer].  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 Authorizing Provider  etonogestrel-ethinyl estradiol (NUVARING) 0.12-0.015 MG/24HR vaginal ring Place 1 each vaginally every 28 (twenty-eight) days. Insert vaginally and leave in place for 3 consecutive weeks, then remove for 1 week.   Yes Historical Provider, MD  etonogestrel-ethinyl estradiol (NUVARING) 0.12-0.015 MG/24HR vaginal ring Insert vaginally and leave in place for 3 consecutive weeks, then remove for 1 week. 12/29/13  Yes Allie BossierMyra C Dove, MD  mirtazapine (REMERON) 15 MG tablet TAKE 1 TABLET BY MOUTH EVERY NIGHT AT BEDTIME 01/07/13  Yes Nelva NayHeather M Marte, PA-C   Review of Systems  Constitutional: Negative for fever and chills.  HENT: Positive for dental problem, sinus pressure and sore throat.   Eyes: Positive for photophobia, pain and itching.  Gastrointestinal: Positive for vomiting.  Musculoskeletal: Positive for arthralgias and neck stiffness.  Neurological: Positive for headaches.  All other systems reviewed and are negative.      Objective:   Physical Exam  Nursing note and vitals reviewed. Constitutional: She is oriented to person, place, and time. She appears well-developed and well-nourished. No distress.  HENT:  Head: Normocephalic and atraumatic.  Right Ear: External  ear normal.  Left Ear: External ear normal.  Nose: Nose normal.  Eyes: Conjunctivae and EOM are normal.  Neck: Neck supple. No tracheal deviation present.  Cardiovascular: Normal rate.   Pulmonary/Chest: Effort normal. No respiratory distress.  Abdominal: She exhibits no distension and no mass. There is no tenderness. There is no rebound and no guarding.  Musculoskeletal: Normal range of motion.  Neurological: She is alert and oriented to person, place, and time.  Skin: Skin is warm and dry.  Psychiatric: She has a normal mood and affect. Her behavior is  normal.   Results for orders placed in visit on 01/15/14  POCT UA - MICROSCOPIC ONLY      Result Value Ref Range   WBC, Ur, HPF, POC 1-3     RBC, urine, microscopic 0-2     Bacteria, U Microscopic 1+     Mucus, UA positive     Epithelial cells, urine per micros 1-3     Crystals, Ur, HPF, POC neg     Casts, Ur, LPF, POC neg     Yeast, UA neg    POCT URINALYSIS DIPSTICK      Result Value Ref Range   Color, UA yellow     Clarity, UA slightly cloudy     Glucose, UA neg     Bilirubin, UA neg     Ketones, UA neg     Spec Grav, UA 1.025     Blood, UA neg     pH, UA 5.0     Protein, UA trace     Urobilinogen, UA 0.2     Nitrite, UA neg     Leukocytes, UA Negative     Assessment & Plan:  Given that patient has a normal neurological exam, normal HEENT exam, after that she has a migraine problem. The rest of her physical is normal. We reviewed those issues that are on the review of systems and she will followup with her GYN regarding the menstrual irregularity and discharge.  Migraine without aura and without status migrainosus, not intractable - Plan: topiramate (TOPAMAX) 50 MG tablet  Routine general medical examination at a health care facility - Plan: CBC, Comprehensive metabolic panel, POCT UA - Microscopic Only, POCT urinalysis dipstick, Lipid panel  Signed, Elvina SidleKurt Lauenstein, MD

## 2017-09-23 ENCOUNTER — Encounter: Payer: Self-pay | Admitting: Obstetrics & Gynecology

## 2017-10-07 ENCOUNTER — Encounter: Payer: Self-pay | Admitting: Obstetrics and Gynecology

## 2017-10-07 ENCOUNTER — Ambulatory Visit (INDEPENDENT_AMBULATORY_CARE_PROVIDER_SITE_OTHER): Payer: BLUE CROSS/BLUE SHIELD | Admitting: Obstetrics and Gynecology

## 2017-10-07 VITALS — BP 120/86 | HR 91 | Wt 300.4 lb

## 2017-10-07 DIAGNOSIS — Z3169 Encounter for other general counseling and advice on procreation: Secondary | ICD-10-CM | POA: Diagnosis not present

## 2017-10-07 DIAGNOSIS — Z01419 Encounter for gynecological examination (general) (routine) without abnormal findings: Secondary | ICD-10-CM | POA: Diagnosis not present

## 2017-10-07 DIAGNOSIS — Z1151 Encounter for screening for human papillomavirus (HPV): Secondary | ICD-10-CM | POA: Diagnosis not present

## 2017-10-07 DIAGNOSIS — Z113 Encounter for screening for infections with a predominantly sexual mode of transmission: Secondary | ICD-10-CM | POA: Diagnosis not present

## 2017-10-07 DIAGNOSIS — Z124 Encounter for screening for malignant neoplasm of cervix: Secondary | ICD-10-CM | POA: Diagnosis not present

## 2017-10-07 DIAGNOSIS — Z803 Family history of malignant neoplasm of breast: Secondary | ICD-10-CM | POA: Insufficient documentation

## 2017-10-07 NOTE — Patient Instructions (Signed)
[facility] [place]

## 2017-10-07 NOTE — Progress Notes (Signed)
Obstetrics and Gynecology Annual Patient Evaluation  Appointment Date: 10/07/2017  OBGYN Clinic: [facility]  Primary Care Provider: Patient, No Pcp Per  Chief Complaint:  Chief Complaint  Patient presents with  . Gynecologic Exam    History of Present Illness: Cassandra Wyatt is a 37 y.o. Caucasian G0 (Patient's last menstrual period was 09/20/2017.), seen for the above chief complaint. Her past medical history is significant for BMI 40s   Infertility: pt states that her and her husband have been trying for several years to get pregnant but haven't been able to. She has been using an app to track her fertility for much of that time, too.    No breast s/s, fevers, chills, chest pain, SOB, nausea, vomiting, abdominal pain, dysuria, hematuria, vaginal itching, dyspareunia, diarrhea, constipation, blood in BMs  Review of Systems:  as noted in the History of Present Illness.   Past Medical History:  Past Medical History:  Diagnosis Date  . Abnormal Pap smear of cervix 2007  . Allergy   . Depression   . GERD (gastroesophageal reflux disease)   . History of kidney problems   . Insomnia   . Obesity   . Ureter obstruction 1996   bilateral bifurcated ureters    Past Surgical History:  Past Surgical History:  Procedure Laterality Date  . COLPOSCOPY    . URETER SURGERY  12/1994   correction of bifurcated ureter    Past Obstetrical History:  OB History  Gravida Para Term Preterm AB Living  0            SAB TAB Ectopic Multiple Live Births                   Past Gynecological History: As per HPI. Periods: qmonth, regular, 3-5d, not particularly heavy or painful History of Pap Smear(s): Yes.   Last pap 2015, which was negative. She is currently using nothing for contraception.   Social History:  Social History   Socioeconomic History  . Marital status: Married    Spouse name: Not on file  . Number of children: Not on file  . Years of  education: Not on file  . Highest education level: Not on file  Social Needs  . Financial resource strain: Not on file  . Food insecurity - worry: Not on file  . Food insecurity - inability: Not on file  . Transportation needs - medical: Not on file  . Transportation needs - non-medical: Not on file  Occupational History  . Not on file  Tobacco Use  . Smoking status: Never Smoker  . Smokeless tobacco: Never Used  Substance and Sexual Activity  . Alcohol use: No  . Drug use: No  . Sexual activity: Yes    Partners: Male    Birth control/protection: Other-see comments    Comment: Nuva Ring  Other Topics Concern  . Not on file  Social History Narrative  . Not on file    Family History:  Family History  Problem Relation Age of Onset  . Diabetes Mother   . Cancer Mother 68       cervical and breast /2005  . Hyperlipidemia Mother   . Cancer Maternal Grandmother         cervical  . Heart attack Maternal Grandmother   . Hyperlipidemia Maternal Grandmother   . Depression Maternal Grandmother   . Insomnia Maternal Grandmother   . Diabetes Maternal Grandfather   . Heart disease Maternal Grandfather   .  Heart attack Maternal Grandfather   . Heart disease Paternal Grandmother   . Thyroid disease Paternal Grandmother   . Heart attack Father   History of uterine cancer and ovarian cancer in the family.  Health Maintenance:  Mammogram(s): Yes.   Date: pt unsure. She states it was for a baseline and it was normal.   Medications None  Allergies Amoxicillin-pot clavulanate and Poison sumac extract   Physical Exam:  BP 120/86   Pulse 91   Wt (!) 300 lb 6.4 oz (136.3 kg)   LMP 09/20/2017   BMI 42.49 kg/m  Body mass index is 42.49 kg/m. General appearance: Well nourished, well developed female in no acute distress.  Neck:  Supple, normal appearance, and no thyromegaly  Cardiovascular: normal s1 and s2.  No murmurs, rubs or gallops. Respiratory:  Clear to auscultation  bilateral. Normal respiratory effort Abdomen: positive bowel sounds and no masses, hernias; diffusely non tender to palpation, non distended Breasts: breasts appear normal, no suspicious masses, no skin or nipple changes or axillary nodes, and normal palpation. Neuro/Psych:  Normal mood and affect.  Skin:  Warm and dry.  Lymphatic:  No inguinal lymphadenopathy.   Pelvic exam: is limited by body habitus EGBUS: within normal limits, Vagina: within normal limits and with no blood or discharge in the vault, Cervix: normal appearing cervix without tenderness, discharge or lesions. Uterus:  nonenlarged and non tender and Adnexa:  normal adnexa and no mass, fullness, tenderness Rectovaginal: deferred  Laboratory: none  Radiology: none  Assessment: pt stable  Plan:  1. Encounter for gynecological examination (general) (routine) without abnormal findings Routine care.  - Cytology - PAP  2. Family history of breast cancer in mother Pt to do screening for hereditary cancer and she is leaning towards doing it if eligible based on criteria. Recommend qyear mammo which she is amenable to. 5 minutes spent in counseling with patient  3. Infertility counseling Patient states her husband is 35 y/o, no children, no medical issues, doesn't smoke, doesn't take any medications and no surgeries that she's aware of. I told her that I recommend a semen analysis at [facility] given risk of female factor. For her, we will schedule a cycle day 3 transvaginal u/s and she will come on that day to get the following labs: FSH, LH, AMH, estradiol, RPR, TSH, HIV, hepatitis b surface antigen, hepatitis c antibody, hemoglobin a1c. 10 minutes spent in counseling with patient.   If these and her SA are negative, she will need an HSG  RTC 3/28  [name], Montez HagemanJr MD Attending [facility](Faculty Practice)

## 2017-10-07 NOTE — Progress Notes (Signed)
Decline flu  

## 2017-10-08 ENCOUNTER — Encounter: Payer: Self-pay | Admitting: Family Medicine

## 2017-10-09 LAB — CYTOLOGY - PAP
Chlamydia: NEGATIVE
Diagnosis: NEGATIVE
HPV: NOT DETECTED
Neisseria Gonorrhea: NEGATIVE
Trichomonas: NEGATIVE

## 2017-10-17 ENCOUNTER — Other Ambulatory Visit: Payer: BLUE CROSS/BLUE SHIELD

## 2017-10-17 ENCOUNTER — Ambulatory Visit (HOSPITAL_COMMUNITY): Payer: BLUE CROSS/BLUE SHIELD | Attending: Obstetrics and Gynecology

## 2017-10-24 ENCOUNTER — Telehealth: Payer: Self-pay

## 2017-10-24 DIAGNOSIS — Z148 Genetic carrier of other disease: Secondary | ICD-10-CM

## 2017-10-24 NOTE — Telephone Encounter (Signed)
Call patient regarding positive generic testing results. LMTCB- Patient will need to be [facility] to further work up. Order has been place.

## 2017-10-25 ENCOUNTER — Ambulatory Visit: Payer: BLUE CROSS/BLUE SHIELD

## 2017-11-13 ENCOUNTER — Ambulatory Visit: Payer: BLUE CROSS/BLUE SHIELD

## 2017-11-14 ENCOUNTER — Encounter: Payer: Self-pay | Admitting: Genetics

## 2017-11-14 ENCOUNTER — Telehealth: Payer: Self-pay | Admitting: Genetics

## 2017-11-14 NOTE — Telephone Encounter (Signed)
Genetic counseling appt has been scheduled for the pt to see Darral DashLindsay Smith on 6/10 at 3pm. Pt aware to arrive 30 minutes early. Letter mailed.

## 2017-12-03 ENCOUNTER — Ambulatory Visit
Admission: RE | Admit: 2017-12-03 | Discharge: 2017-12-03 | Disposition: A | Discharge status: Home / Self Care | Payer: BLUE CROSS/BLUE SHIELD | Source: Ambulatory Visit | Attending: Obstetrics and Gynecology | Admitting: Obstetrics and Gynecology

## 2017-12-03 DIAGNOSIS — Z803 Family history of malignant neoplasm of breast: Secondary | ICD-10-CM

## 2017-12-03 DIAGNOSIS — Z01419 Encounter for gynecological examination (general) (routine) without abnormal findings: Secondary | ICD-10-CM

## 2017-12-30 ENCOUNTER — Inpatient Hospital Stay: Payer: BLUE CROSS/BLUE SHIELD | Attending: Genetic Counselor | Admitting: Genetics

## 2017-12-30 ENCOUNTER — Inpatient Hospital Stay: Payer: BLUE CROSS/BLUE SHIELD

## 2017-12-30 DIAGNOSIS — Z808 Family history of malignant neoplasm of other organs or systems: Secondary | ICD-10-CM | POA: Diagnosis not present

## 2017-12-30 DIAGNOSIS — Z803 Family history of malignant neoplasm of breast: Secondary | ICD-10-CM

## 2017-12-30 DIAGNOSIS — Z8049 Family history of malignant neoplasm of other genital organs: Secondary | ICD-10-CM | POA: Diagnosis not present

## 2017-12-30 DIAGNOSIS — Z801 Family history of malignant neoplasm of trachea, bronchus and lung: Secondary | ICD-10-CM

## 2017-12-31 ENCOUNTER — Encounter: Payer: Self-pay | Admitting: Genetics

## 2017-12-31 DIAGNOSIS — Z8049 Family history of malignant neoplasm of other genital organs: Secondary | ICD-10-CM | POA: Insufficient documentation

## 2017-12-31 DIAGNOSIS — Z803 Family history of malignant neoplasm of breast: Secondary | ICD-10-CM | POA: Insufficient documentation

## 2017-12-31 DIAGNOSIS — Z808 Family history of malignant neoplasm of other organs or systems: Secondary | ICD-10-CM | POA: Insufficient documentation

## 2017-12-31 DIAGNOSIS — Z801 Family history of malignant neoplasm of trachea, bronchus and lung: Secondary | ICD-10-CM | POA: Insufficient documentation

## 2017-12-31 NOTE — Progress Notes (Signed)
REFERRING PROVIDER: Aletha Halim, MD [place], [place] [address]   PRIMARY PROVIDER:  Patient, No Pcp Per  PRIMARY REASON FOR VISIT:  1. Family history of breast cancer   2. Family history of cervical cancer   3. Family history of melanoma   4. Family history of lung cancer   5. Family history of uterine cancer     HISTORY OF PRESENT ILLNESS:   Cassandra Wyatt, a 37 y.o. female, was seen for a [place] cancer genetics consultation at the request of Dr.Pickens, her OBGYN due to a family history of cancer and to discuss her genetic test results.  Cassandra Wyatt presents to clinic today to discuss the possibility of a hereditary predisposition to cancer, genetic testing, and to further clarify her future cancer risks, as well as potential cancer risks for family members.   Cassandra Wyatt is a 37 y.o. female with no personal history of cancer.  She does have a history of an abnormal PAP smear and ovarian cysts.   Cassandra Wyatt underwent genetic testing through her OBGYN's office that was reported out on 10/22/2017.  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     HORMONAL RISK FACTORS:  Menarche was at age 64.  First live birth at age N/A.  OCP use for approximately 16 years.  Ovaries intact: yes.  Hysterectomy: no.  Menopausal status: premenopausal.  HRT use: 0 years. Colonoscopy: no; not examined. Mammogram within the last year: yes May 2019.  Number of breast biopsies: 0.  Past Medical History:  Diagnosis Date  . Abnormal Pap smear of cervix 2007  . Allergy   . Depression   . Family history of breast cancer   . Family history of cervical cancer   . Family history of lung cancer   . Family history of melanoma   . Family history of uterine cancer   . GERD (gastroesophageal reflux disease)   . History of kidney problems   . Insomnia   . Obesity   . 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 COLPOSCOPY    . URETER SURGERY  12/1994   correction of bifurcated ureter    Social History   Socioeconomic History  . Marital status: Married    Spouse name: Not on file  . Number of children: Not on file  . Years of education: Not on file  . Highest education level: Not on file  Occupational History  . Not on file  Social Needs  . Financial resource strain: Not on file  . Food insecurity:    Worry: Not on file    Inability: Not on file  . Transportation needs:    Medical: Not on file    Non-medical: Not on file  Tobacco Use  . Smoking status: Never Smoker  . Smokeless tobacco: Never Used  Substance and Sexual Activity  . Alcohol use: No  . Drug use: No  . Sexual activity: Yes    Partners: Male    Birth control/protection: Other-see comments    Comment: Nuva Ring  Lifestyle  . Physical activity:    Days per week: Not on file    Minutes per session: Not on file  . Stress: Not on file  Relationships  . Social connections:    Talks on phone: Not on file    Gets together: Not on file  Attends religious service: Not on file    Active member of club or organization: Not on file    Attends meetings of clubs or organizations: Not on file    Relationship status: Not on file  Other Topics Concern  . Not on file  Social History Narrative  . Not on file     FAMILY HISTORY:  We obtained a detailed, 4-generation family history.  Significant diagnoses are listed below: Family History  Problem Relation Age of Onset  . Diabetes Mother   . Cancer Mother 33       cervical and breast /2005  . Hyperlipidemia Mother   . Cancer Maternal Grandmother 69        cervical  . Heart attack Maternal Grandmother   . Hyperlipidemia Maternal Grandmother   . Depression Maternal Grandmother   . Insomnia Maternal Grandmother   . Diabetes Maternal Grandfather   . Heart disease Maternal Grandfather   . Heart attack Maternal  Grandfather   . Heart disease Paternal Grandmother   . Thyroid disease Paternal Grandmother   . Heart attack Father   . Other Sister        had a hystereresctomy at 63 unk cause- maybe cv cancer?  Marland Kitchen Uterine cancer Maternal Aunt        glassy cell carcinoma   . Cancer Paternal Aunt        all had hysterectomies due to a gyn cancer- pt thinks ovarian ca, age unk  . Other Maternal Aunt        precancerous cervical cells  . Throat cancer Cousin 3  . Lung cancer Cousin 37       hx smoking  . Melanoma Other        2-3, had alot of sun exposure  . Lung cancer Other   . Mesothelioma Other        asbestos exposure   Cassandra Wyatt has no children. Cassandra Wyatt has a paternal half-sister who had a hysterectomy at age 35-.  Cassandra Wyatt father: 1, no known history of cancer, limited information.  Paternal Aunts/Uncles: limited info- patient knows that 3 paternal aunts all had hysterectomies to a gyn cancer.  They are all deceased.  Cassandra Wyatt thinks maybe ovarian cancer, but does not know for sure what type of gyn cancer they had.   Paternal cousins: no info Paternal grandfather: unk Paternal grandmother:unk  Cassandra Wyatt mother: currently 66, hx of breast cancer dx at 53 and cervical cancer dx at 81.  She had a hysterectomy in 2005 (age 79). She has had 2 colon polyps, 1 was precancerous, told to have another colonoscopy in 10 years.  Maternal Aunts/Uncles: 2 maternal aunts. 1 is 54 and has a history of precancerous cervical cells.  The other maternal aunt died at 37 due to uterine cancer (glassy cell).  She died very quickly after diagnosis.  Maternal cousins: 3 maternal cousins, 1 is 47 and has recently been diagnosed with throat and lung cancer.  He has a history of smoking.  Maternal grandfather: died at 41 due to heart disease.  Maternal grandmother:alive, 72, hx of cervical cancer dx at 61.  This grandmother has a sister who has had 2-3 melanoma skin cancers removed (she had a lot of sun exposure  and a brother who had lung cancer.  This grandmother's mother had mesothelioma and had reported asbestos exposure.   Cassandra Wyatt is unaware of previous family history of genetic testing for hereditary cancer risks. Patient's maternal ancestors  are of Caucasian descent, and paternal ancestors are of Caucasian/African American descent. There is no reported Ashkenazi Jewish ancestry. There is no known consanguinity.  GENETIC COUNSELING ASSESSMENT: Cassandra Wyatt is a 37 y.o. female with a family history which is somewhat suggestive of a Hereditary Cancer Predisposition Syndrome. We, therefore, discussed and recommended the following at today's visit.   DISCUSSION OF RESULTS:  We reviewed the characteristics, features and inheritance patterns of hereditary cancer syndromes. We discussed there are many cancer predisposition syndromes caused by mutations in many different genes. Cassandra Wyatt' genetic test analyzed a panel of genes that included genes associated with Breast, Gyn, prostate, and GI cancer risks.    Cassandra Wyatt test result is considered negative (normal).  This means that genetic testing has not identified a hereditary predisposition to cancer in her.    While reassuring, this does not definitively rule out a hereditary predisposition to cancer. It is still possible that there could be genetic mutations that are undetectable by current technology, or genetic mutations in genes that have not been tested or identified to increase cancer risk.    It is also possible there is still a hereditary cause for the caner in Cassandra Wyatt' family that she did not inherit and therefore was not found in her.  Therefore, we recommend Cassandra Wyatt mother and paternal relatives (if paternal aunts had ovarian cancer) also pursue genetic testing.  There could still be a genetic mutation in them that was not found in Cassandra Wyatt.   This was a relatively comprehensive panel that analyzed the genes associated with most of the cancers  seen in her family.  At this point in time, we have not identified a gene associated with hereditary cervical cancer.  Certainly,  is it still very important for Cassandra Wyatt and the women in her family to have regular PAP smears to screen for cervical cancer.    We discussed that generally, lung cancer is not typically hereditary or caused by a germline genetic mutation. However, there is one gene, EGFR, in which specific mutations have been associated with hereditary lung cancer.  Ms. Crusoe was interested in having this gene analyzed given the family history of lung cancer and the young age of diagnosis in her cousin (29).  We discussed mutations in this gene are rare, but that analysis could be added on to her previously ordered panel. We will reach out to Dr. Ilda Basset to see if he could add on analysis of this 1 extra gene.   Variants of Uncertain Significance (VUS's) in BRCA2 and MLH1: Ms. Roads' genetic testing detected 2 Variants of Unknown Significance in the genes called BRCA2 and MLH1.   At this time, it is unknown if these variants are associated with increased cancer risk or if they are just normal findings (normal human variation) .  The large majority of VUS's are eventually reclassified to benign.  Given that these variants are most likely benign, they should not be used to make medical management decisions. With time, we suspect the lab will determine the significance of these variants, if any. If the variants are ever reclassified in the future, the ordering provider, Dr. Ilda Basset, will be notified and sent a new updated report.  We recommend Ms. Navedo discuss how she will find out about a reclassification with his office.    CANCER SCREENING: It is recommended Ms. Apsey continue to follow the cancer management and screening guidelines provided by her healthcare providers. An individual's cancer risk is  not determined by genetic test results alone.  Overall cancer risk assessment includes  additional factors such as personal medical history, family history, etc.  These should be used to make a personalized plan for cancer prevention and surveillance.    Based on the patient's personal and family history, the statistical model (Tyrer Cusik)   Was used to estimate her risk of developing breast cancer. This estimates her lifetime risk of developing breast cancer to be approximately 27.2%. The patient's lifetime breast cancer risk is a preliminary estimate based on available information using one of several models endorsed by the [employer] (ACS). The ACS recommends consideration of breast MRI screening as an adjunct to mammography for patients at high risk (defined as 20% or greater lifetime risk).   Ms. Heldt has been determined to be at high risk for breast cancer.  Therefore, we recommend that annual screening with mammography and breast MRI begin at age 69, or 10 years prior to the age of breast cancer diagnosis in a relative (whichever is earlier).  We discussed that Ms. Dejonge should discuss her individual case with her referring physician and determine a breast cancer screening plan with which they are both comfortable.    This risk estimate will change over the course of Ms. Yamamoto lifetime (age, hormone history, family history changes, etc).  This assessment should be repeated periodically to determine if increased surveillance is still warranted in the future.      PLAN:   1. We will ask Dr. Ilda Basset if he can reflex her genetic testing to include analysis of the EGFR gene.   2. Reccommend follow-up discussion about high risk breast screening protocol with her OGBYN.      3.  Recommended patient's mother have genetic testing (her test will be most informative regarding breast cancer risk for the family)  4.  Genetic test ordering physician, Aletha Halim, will receive updated test report if variants of uncertain significance are ever reclassified in the  future.  Lastly, we encouraged Ms. Syfert to remain in contact with cancer genetics annually so that we can continuously update the family history and inform her of any changes in cancer genetics and testing that may be of benefit for this family.   Ms.  Schexnider questions were answered to her satisfaction today. Our contact information was provided should additional questions or concerns arise. Thank you for the referral and allowing [place] to share in the care of your patient.   Tana Felts, MS, [facility] Certified Genetic Counselor [email or web address] phone: [phone]  The patient was seen for a total of 35 minutes in face-to-face genetic counseling.  The patient was accompanied today by her mother.  This patient was discussed with Drs. Magrinat, Lindi Adie and/or Burr Medico who agrees with the above.

## 2018-06-04 ENCOUNTER — Other Ambulatory Visit: Payer: Self-pay | Admitting: Orthopedic Surgery

## 2018-06-04 DIAGNOSIS — M25562 Pain in left knee: Secondary | ICD-10-CM

## 2018-06-17 ENCOUNTER — Ambulatory Visit
Admission: RE | Admit: 2018-06-17 | Discharge: 2018-06-17 | Disposition: A | Discharge status: Home / Self Care | Payer: BLUE CROSS/BLUE SHIELD | Source: Ambulatory Visit | Attending: Orthopedic Surgery | Admitting: Orthopedic Surgery

## 2018-06-17 DIAGNOSIS — M25562 Pain in left knee: Secondary | ICD-10-CM

## 2019-11-25 ENCOUNTER — Other Ambulatory Visit: Payer: Self-pay | Admitting: Certified Nurse Midwife

## 2019-11-26 ENCOUNTER — Other Ambulatory Visit: Payer: Self-pay | Admitting: Certified Nurse Midwife

## 2019-11-27 ENCOUNTER — Other Ambulatory Visit: Payer: Self-pay | Admitting: Certified Nurse Midwife

## 2019-11-27 ENCOUNTER — Other Ambulatory Visit: Payer: Self-pay | Admitting: Family Medicine

## 2019-11-27 DIAGNOSIS — E282 Polycystic ovarian syndrome: Secondary | ICD-10-CM

## 2019-11-27 DIAGNOSIS — Q899 Congenital malformation, unspecified: Secondary | ICD-10-CM

## 2019-12-01 ENCOUNTER — Other Ambulatory Visit: Payer: Self-pay | Admitting: Certified Nurse Midwife

## 2019-12-01 DIAGNOSIS — E041 Nontoxic single thyroid nodule: Secondary | ICD-10-CM

## 2019-12-01 DIAGNOSIS — E282 Polycystic ovarian syndrome: Secondary | ICD-10-CM

## 2019-12-01 DIAGNOSIS — Q899 Congenital malformation, unspecified: Secondary | ICD-10-CM

## 2019-12-11 ENCOUNTER — Ambulatory Visit
Admission: RE | Admit: 2019-12-11 | Discharge: 2019-12-11 | Disposition: A | Discharge status: Home / Self Care | Payer: BLUE CROSS/BLUE SHIELD | Source: Ambulatory Visit | Attending: Certified Nurse Midwife | Admitting: Certified Nurse Midwife

## 2019-12-11 DIAGNOSIS — Q899 Congenital malformation, unspecified: Secondary | ICD-10-CM

## 2019-12-11 DIAGNOSIS — E041 Nontoxic single thyroid nodule: Secondary | ICD-10-CM

## 2019-12-11 DIAGNOSIS — E282 Polycystic ovarian syndrome: Secondary | ICD-10-CM

## 2020-09-21 ENCOUNTER — Other Ambulatory Visit: Payer: Self-pay

## 2020-09-21 ENCOUNTER — Telehealth: Payer: Self-pay

## 2020-09-21 ENCOUNTER — Ambulatory Visit
Admission: EM | Admit: 2020-09-21 | Discharge: 2020-09-21 | Disposition: A | Discharge status: Home / Self Care | Payer: 59 | Attending: Family Medicine | Admitting: Family Medicine

## 2020-09-21 DIAGNOSIS — J069 Acute upper respiratory infection, unspecified: Secondary | ICD-10-CM | POA: Insufficient documentation

## 2020-09-21 LAB — POCT RAPID STREP A (OFFICE): Rapid Strep A Screen: NEGATIVE

## 2020-09-21 MED ORDER — ALBUTEROL SULFATE HFA 108 (90 BASE) MCG/ACT IN AERS
2.0000 | INHALATION_SPRAY | Freq: Once | RESPIRATORY_TRACT | Status: AC
  Administered 2020-09-21: 2 via RESPIRATORY_TRACT

## 2020-09-21 MED ORDER — PREDNISONE 20 MG PO TABS: 20.0000 mg | ORAL_TABLET | Freq: Every day | ORAL | 0 refills | Status: AC

## 2020-09-21 NOTE — ED Triage Notes (Signed)
Pt c/o sore throat and chest congestion since yesterday. States hx of strep and bronchitis and this feels the same.

## 2020-09-21 NOTE — Telephone Encounter (Signed)
Add on strep culture per Selena Batten FNP

## 2020-09-21 NOTE — Discharge Instructions (Signed)
For chest heaviness or tightness, or cyclic coughing take 2 puffs of albuterol inhaler every 4-6 hours as needed.  For throat irritation which I suspect is related to postnasal drainage and congestion start prednisone 20 mg once daily for total of 5 days. Also would recommend starting an over-the-counter antihistamine given the changes in weather you may start experienced some nasal inflammation related to outdoor allergens.

## 2020-09-21 NOTE — ED Provider Notes (Signed)
[facility]    CSN: [id number] Arrival date & time: 09/21/20  1110      History   Chief Complaint Chief Complaint  Patient presents with  . Sore Throat    HPI Cassandra Wyatt is a 40 y.o. female.   HPI Patient here for evaluation of cough with soreness of throat x1 day.  Patient is afebrile.  She reports this morning awakening with hoarseness and pain with swallowing.  She endorses occasional seasonal allergies but has never taken chronic year-round antihistamine therapy.  Patient has a history of COVID in December 2021.  Patient has not attempted relief with any OTC medications.  Past Medical History:  Diagnosis Date  . Abnormal Pap smear of cervix 2007  . Allergy   . Depression   . Family history of breast cancer   . Family history of cervical cancer   . Family history of lung cancer   . Family history of melanoma   . Family history of uterine cancer   . GERD (gastroesophageal reflux disease)   . History of kidney problems   . Insomnia   . Obesity   . 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 Family history of breast cancer   . Family history of cervical cancer   . Family history of melanoma   . Family history of lung cancer   . Family history of uterine cancer   . Infertility counseling 10/07/2017  . Family history of breast cancer in mother 10/07/2017  . 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 Other and unspecified ovarian cyst 04/29/2013  . Stenosis, cervix 08/07/2011  . History of abnormal Pap smear 08/07/2011  . Heavy menstrual bleeding 07/10/2011    Past Surgical History:  Procedure Laterality Date  . COLPOSCOPY    . URETER SURGERY  12/1994   correction of bifurcated ureter    OB History    Gravida  0   Para      Term      Preterm      AB      Living        SAB      IAB      Ectopic      Multiple      Live Births                Home Medications    Prior to Admission medications   Medication Sig Start Date End Date Taking? Authorizing Provider  buPROPion (WELLBUTRIN) 100 MG tablet Take 150 mg by mouth.   Yes [provider]  levothyroxine (SYNTHROID) 50 MCG tablet Take 50 mcg by mouth daily before breakfast.   Yes [provider]  spironolactone (ALDACTONE) 25 MG tablet Take 25 mg by mouth once.   Yes [provider]    Family History Family History  Problem Relation Age of Onset  . Diabetes Mother   . Cancer Mother 48       cervical and breast /2005  . Hyperlipidemia Mother   . Cancer Maternal Grandmother 69        cervical  . Heart attack Maternal Grandmother   . Hyperlipidemia Maternal Grandmother   . Depression Maternal Grandmother   . Insomnia Maternal Grandmother   . Diabetes Maternal Grandfather   . Heart disease Maternal Grandfather   . Heart attack Maternal Grandfather   . Heart disease Paternal Grandmother   .  Thyroid disease Paternal Grandmother   . Heart attack Father   . Other Sister        had a hystereresctomy at 28 unk cause- maybe cv cancer?  Marland Kitchen Uterine cancer Maternal Aunt        glassy cell carcinoma   . Cancer Paternal Aunt        all had hysterectomies due to a gyn cancer- pt thinks ovarian ca, age unk  . Other Maternal Aunt        precancerous cervical cells  . Throat cancer Cousin 37  . Lung cancer Cousin 37       hx smoking  . Melanoma Other        2-3, had alot of sun exposure  . Lung cancer Other   . Mesothelioma Other        asbestos exposure    Social History Social History   Tobacco Use  . Smoking status: Never Smoker  . Smokeless tobacco: Never Used  Substance Use Topics  . Alcohol use: No  . Drug use: No     Allergies   Amoxicillin-pot clavulanate and Poison sumac extract   Review of Systems Review of Systems Pertinent negatives listed in HPI  Physical Exam Triage Vital Signs ED Triage Vitals  Enc Vitals  Group     BP 09/21/20 1126 122/77     Pulse Rate 09/21/20 1126 89     Resp 09/21/20 1126 18     Temp 09/21/20 1126 98.5 F (36.9 C)     Temp Source 09/21/20 1126 Oral     SpO2 09/21/20 1126 96 %     Weight --      Height --      Head Circumference --      Peak Flow --      Pain Score 09/21/20 1127 7     Pain Loc --      Pain Edu? --      Excl. in GC? --    No data found.  Updated Vital Signs BP 122/77 (BP Location: Left Arm)   Pulse 89   Temp 98.5 F (36.9 C) (Oral)   Resp 18   LMP 08/30/2020   SpO2 96%   Visual Acuity Right Eye Distance:   Left Eye Distance:   Bilateral Distance:    Right Eye Near:   Left Eye Near:    Bilateral Near:     Physical Exam  General Appearance:    Alert, cooperative, no distress  HENT:   Normocephalic, ears normal, nares mucosal edema with congestion, oropharynx mild swelling without erythema or exudate.  Eyes:    PERRL, conjunctiva/corneas clear, EOM's intact       Lungs:     Clear to auscultation bilaterally, respirations unlabored  Heart:    Regular rate and rhythm  Neurologic:   Awake, alert, oriented x 3. No apparent focal neurological           defect.      UC Treatments / Results  Labs (all labs ordered are listed, but only abnormal results are displayed) Labs Reviewed  POCT RAPID STREP A (OFFICE)    EKG   Radiology No results found.  Procedures Procedures (including critical care time)  Medications Ordered in UC Medications - No data to display  Initial Impression / Assessment and Plan / UC Course  I have reviewed the triage vital signs and the nursing notes.  Pertinent labs & imaging results that were available during my care of  the patient were reviewed by me and considered in my medical decision making (see chart for details).     Treating for viral upper respiratory illness.  Patient is afebrile symptoms present less than 24 hours.  Patient is nontoxic non-ill-appearing.  However endorses some chest  heaviness and tightness with a mild nonproductive cough.  Start albuterol inhaler 2 puffs every 4-6 hours as needed for chest tightness or cyclic coughing.  For throat pain and swelling and postnasal drainage start prednisone 20 mg once daily for total of 5 days.  Follow-up with primary care provider.  Final Clinical Impressions(s) / UC Diagnoses   Final diagnoses:  Viral URI     Discharge Instructions     For chest heaviness or tightness, or cyclic coughing take 2 puffs of albuterol inhaler every 4-6 hours as needed.  For throat irritation which I suspect is related to postnasal drainage and congestion start prednisone 20 mg once daily for total of 5 days. Also would recommend starting an over-the-counter antihistamine given the changes in weather you may start experienced some nasal inflammation related to outdoor allergens.    ED Prescriptions    Medication Sig Dispense Auth. Provider   predniSONE (DELTASONE) 20 MG tablet Take 1 tablet (20 mg total) by mouth daily with breakfast for 5 days. 5 tablet Bing Neighbors, FNP     PDMP not reviewed this encounter.   Bing Neighbors, FNP 09/21/20 1213

## 2020-09-23 LAB — CULTURE, GROUP A STREP (THRC)

## 2020-09-25 LAB — CULTURE, GROUP A STREP (THRC)

## 2021-05-15 ENCOUNTER — Other Ambulatory Visit: Payer: Self-pay | Admitting: Endocrinology

## 2021-05-15 DIAGNOSIS — E230 Hypopituitarism: Secondary | ICD-10-CM

## 2021-06-14 ENCOUNTER — Other Ambulatory Visit: Payer: Self-pay

## 2021-06-14 ENCOUNTER — Ambulatory Visit
Admission: RE | Admit: 2021-06-14 | Discharge: 2021-06-14 | Disposition: A | Discharge status: Home / Self Care | Payer: BC Managed Care – PPO | Source: Ambulatory Visit | Attending: Endocrinology | Admitting: Endocrinology

## 2021-06-14 DIAGNOSIS — E230 Hypopituitarism: Secondary | ICD-10-CM

## 2021-06-14 MED ORDER — GADOBENATE DIMEGLUMINE 529 MG/ML IV SOLN
10.0000 mL | Freq: Once | INTRAVENOUS | Status: AC | PRN
  Administered 2021-06-14: 10 mL via INTRAVENOUS

## 2022-07-18 ENCOUNTER — Other Ambulatory Visit: Payer: Self-pay | Admitting: Endocrinology

## 2022-07-18 DIAGNOSIS — E041 Nontoxic single thyroid nodule: Secondary | ICD-10-CM

## 2023-09-27 IMAGING — MR MR HEAD WO/W CM
20 series · 48 of 48 positions shown · IV contrast (10 ml multihance)
Comparison: None.

CLINICAL DATA: Hypopituitarism.

EXAM:
MRI HEAD WITHOUT AND WITH CONTRAST
TECHNIQUE: Multiplanar, multiecho pulse sequences of the brain and surrounding
structures were obtained without and with intravenous contrast.
CONTRAST:  10mL MULTIHANCE GADOBENATE DIMEGLUMINE 529 MG/ML IV SOLN

[Series 5: T1 · sagittal · 4.0mm · 0.72mm/px · 1 of 28 slices shown (1 of 5)]
[im 1/28]
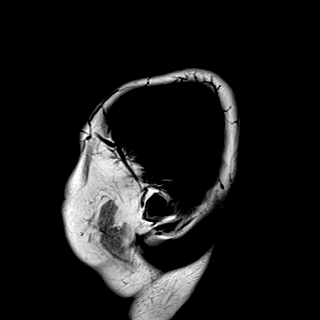

[Series 6: DWI · axial · 3.0mm · 0.94mm/px · z∈[-63,+74]mm · 10 of 160 slices shown]
[im 1/160]
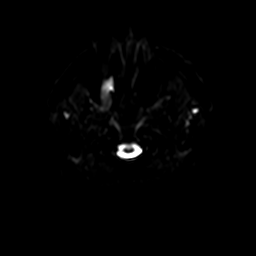
[im 18/160]
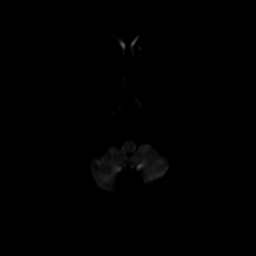
[im 36/160]
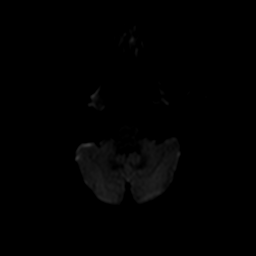
[im 54/160]
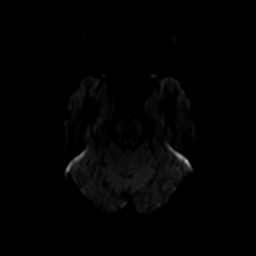
[im 71/160]
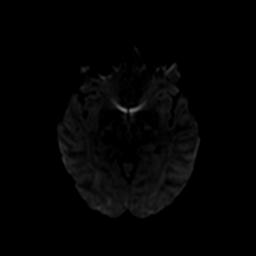
[im 89/160]
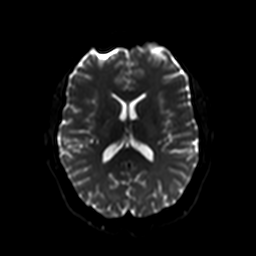
[im 107/160]
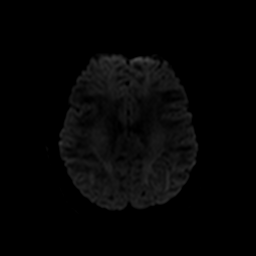
[im 124/160]
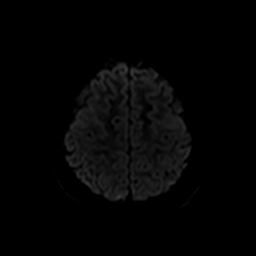
[im 142/160]
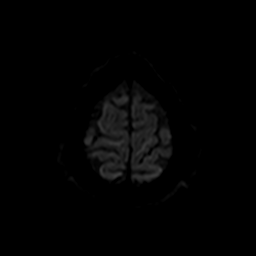
[im 160/160]
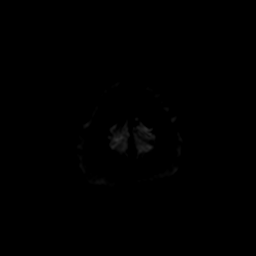

[Series 7: ax dwi_tracew · axial · 3.0mm · 0.94mm/px · z∈[-63,+74]mm · 5 of 80 slices shown]
[im 1/80]
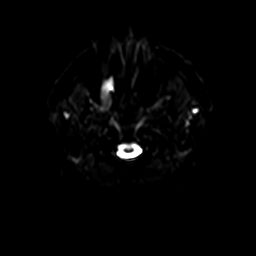
[im 20/80]
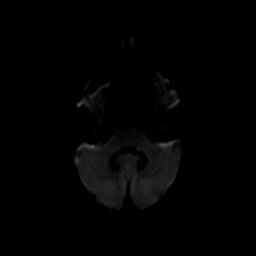
[im 40/80]
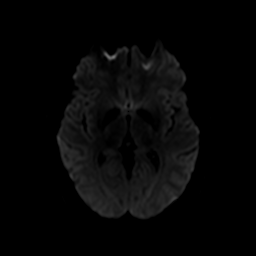
[im 60/80]
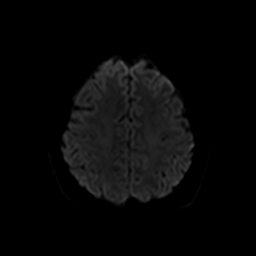
[im 80/80]
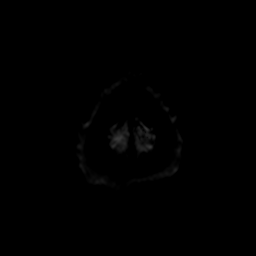

[Series 8: ax dwi_adc · axial · 3.0mm · 0.94mm/px · z∈[-63,+74]mm · 2 of 40 slices shown]
[im 1/40]
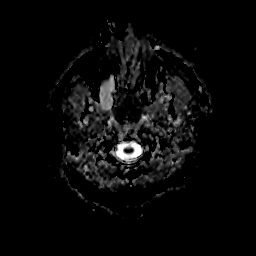
[im 40/40]
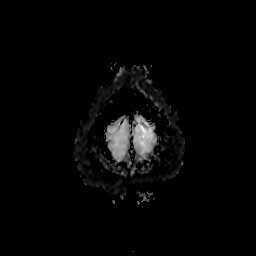

[Series 9: T2 · axial · 4.0mm · 0.36mm/px · z∈[-62,+70]mm · 2 of 27 slices shown (1 of 2)]
[im 1/27]
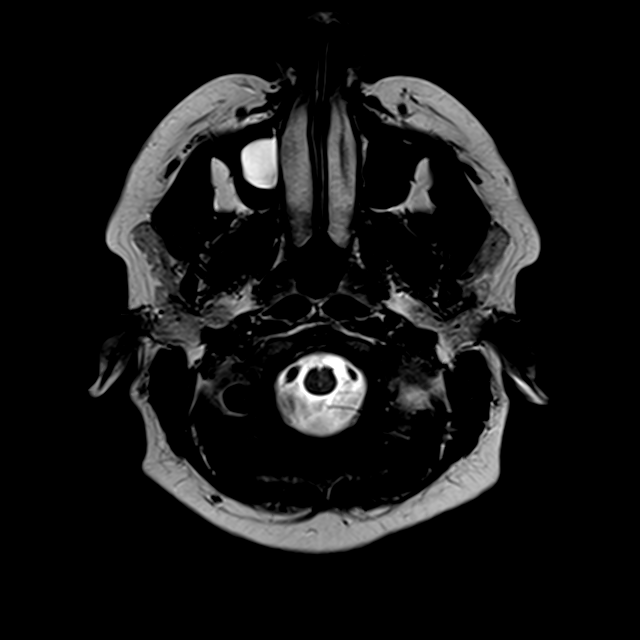
[im 27/27]
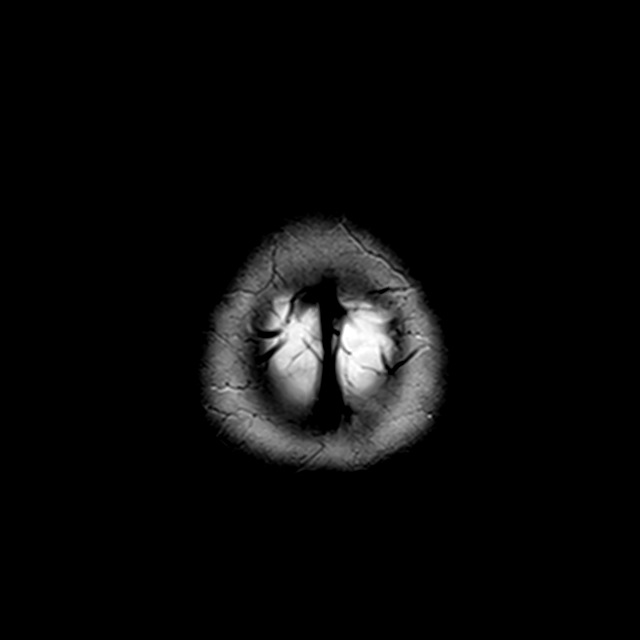

[Series 10: swi_images · axial · 2.3mm · 0.90mm/px · z∈[-66,+76]mm · 4 of 64 slices shown]
[im 1/64]
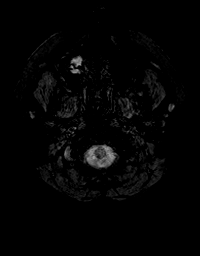
[im 22/64]
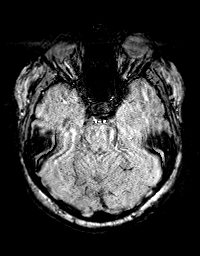
[im 43/64]
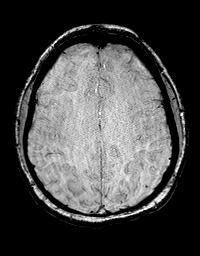
[im 64/64]
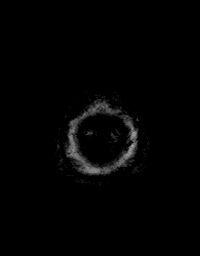

[Series 12: FLAIR · axial · 3.0mm · 0.72mm/px · z∈[-63,+72]mm · 2 of 24 slices shown]
[im 1/24]
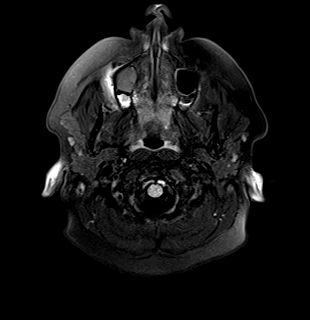
[im 24/24]
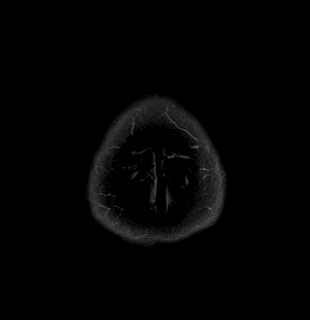

[Series 13: T1 · sagittal · 3.0mm · 0.42mm/px · 1 of 13 slices shown (2 of 5)]
[im 1/13]
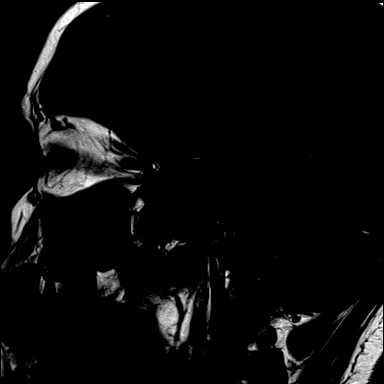

[Series 14: T2 · coronal · 3.0mm · 0.42mm/px · 1 of 10 slices shown (2 of 2)]
[im 1/10]
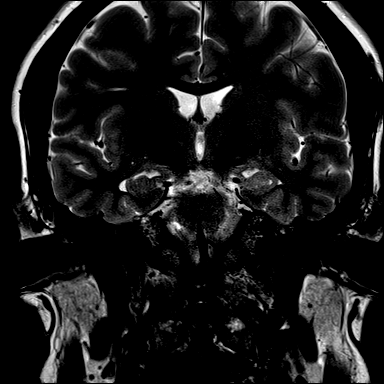

[Series 15: T1 · coronal · 3.0mm · 0.42mm/px · 1 of 10 slices shown (3 of 5)]
[im 1/10]
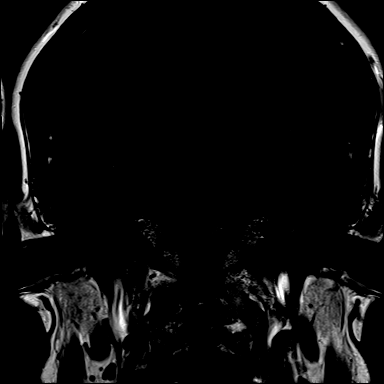

[Series 17: T1 · coronal · non-contrast · 3.0mm · 0.62mm/px · 1 of 9 slices shown (4 of 5)]
[im 1/9]
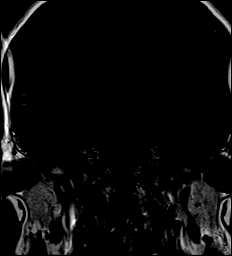

[Series 18: cor post dyn · coronal · 3.0mm · 0.62mm/px · 1 of 9 slices shown (1 of 6)]
[im 1/9]
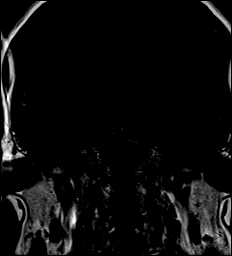

[Series 19: cor post dyn · coronal · 3.0mm · 0.62mm/px · 1 of 9 slices shown (2 of 6)]
[im 1/9]
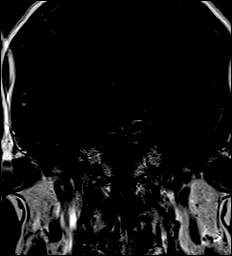

[Series 20: cor post dyn · coronal · 3.0mm · 0.62mm/px · 1 of 9 slices shown (3 of 6)]
[im 1/9]
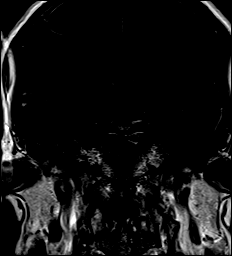

[Series 21: cor post dyn · coronal · 3.0mm · 0.62mm/px · 1 of 9 slices shown (4 of 6)]
[im 1/9]
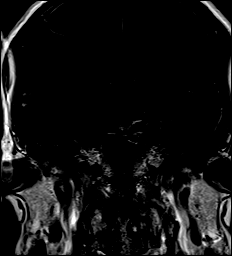

[Series 22: cor post dyn · coronal · 3.0mm · 0.62mm/px · 1 of 9 slices shown (5 of 6)]
[im 1/9]
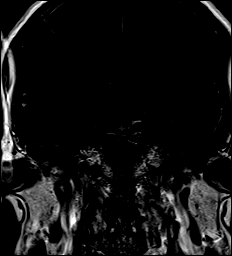

[Series 23: cor post dyn · coronal · 3.0mm · 0.62mm/px · 1 of 9 slices shown (6 of 6)]
[im 1/9]
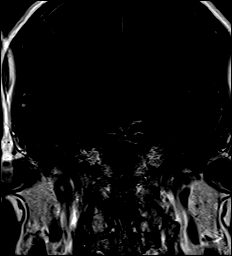

[Series 24: T1 post-contrast · sagittal · 3.0mm · 0.42mm/px · 1 of 13 slices shown (1 of 2)]
[im 1/13]
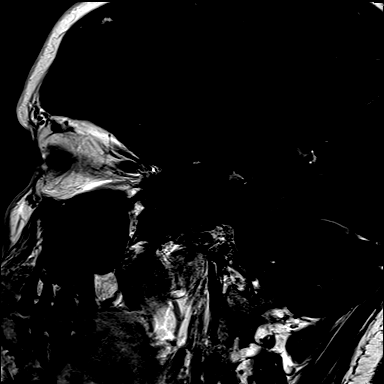

[Series 25: T1 post-contrast · coronal · 3.0mm · 0.42mm/px · 1 of 10 slices shown (2 of 2)]
[im 1/10]
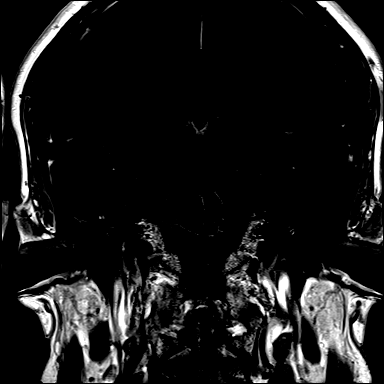

[Series 26: T1 · axial · 1.0mm · 0.86mm/px · z∈[-66,+74]mm · 10 of 144 slices shown (5 of 5)]
[im 1/144]
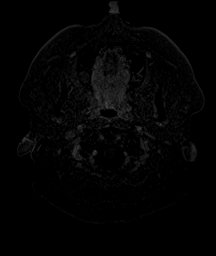
[im 16/144]
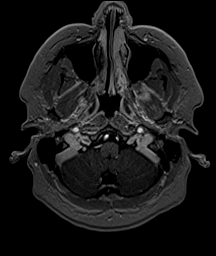
[im 32/144]
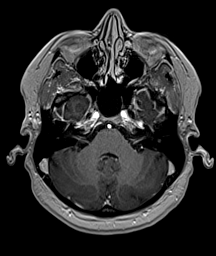
[im 48/144]
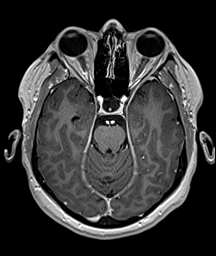
[im 64/144]
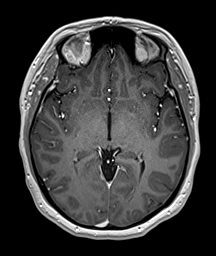
[im 80/144]
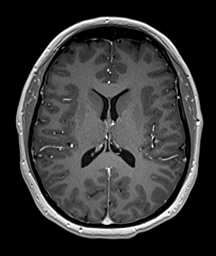
[im 96/144]
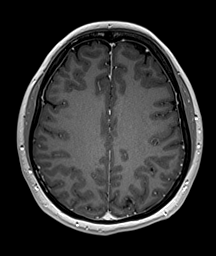
[im 112/144]
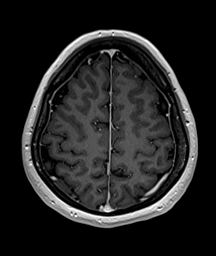
[im 128/144]
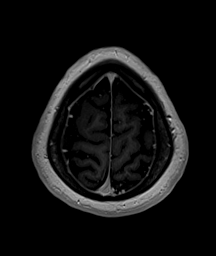
[im 144/144]
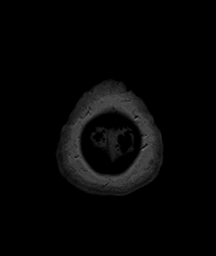

[48 of 48 positions shown; findings below may reference images not displayed]

FINDINGS: Brain: There is no acute infarction or intracranial hemorrhage.
There is no intracranial mass, mass effect, or edema. There is no
hydrocephalus or extra-axial fluid collection. Ventricles and sulci
are normal in size and configuration. No abnormal enhancement.

Vascular: Major vessel flow voids at the skull base are preserved.

Skull and upper cervical spine: Normal marrow signal is preserved.

Sinuses/Orbits: Right maxillary sinus retention cyst. Orbits are
unremarkable.

Other: There is no sellar or suprasellar mass. Pituitary
infundibulum is midline. The sella is partially empty and mildly
expanded. Mastoid air cells are clear.
IMPRESSION: No sellar or suprasellar mass.

Partially empty sella, a nonspecific finding that can be seen in the
setting of idiopathic intracranial hypertension.

## 2023-12-23 ENCOUNTER — Other Ambulatory Visit: Payer: Self-pay | Admitting: Family Medicine

## 2023-12-23 DIAGNOSIS — E039 Hypothyroidism, unspecified: Secondary | ICD-10-CM
